# Patient Record
Sex: Male | Born: 1955 | Race: White | Hispanic: No | Marital: Single | Smoking: Never smoker
Health system: Southern US, Community
[De-identification: ages and names within clinical notes are randomized; demographics above are authoritative.]

## PROBLEM LIST (undated history)

## (undated) DIAGNOSIS — F419 Anxiety disorder, unspecified: Secondary | ICD-10-CM

## (undated) DIAGNOSIS — R739 Hyperglycemia, unspecified: Secondary | ICD-10-CM

## (undated) DIAGNOSIS — F1010 Alcohol abuse, uncomplicated: Secondary | ICD-10-CM

## (undated) DIAGNOSIS — Z8249 Family history of ischemic heart disease and other diseases of the circulatory system: Secondary | ICD-10-CM

## (undated) DIAGNOSIS — E785 Hyperlipidemia, unspecified: Secondary | ICD-10-CM

## (undated) DIAGNOSIS — I10 Essential (primary) hypertension: Secondary | ICD-10-CM

## (undated) DIAGNOSIS — M109 Gout, unspecified: Secondary | ICD-10-CM

## (undated) HISTORY — DX: Family history of ischemic heart disease and other diseases of the circulatory system: Z82.49

## (undated) HISTORY — DX: Hyperglycemia, unspecified: R73.9

## (undated) HISTORY — PX: OTHER SURGICAL HISTORY: SHX169

## (undated) HISTORY — DX: Alcohol abuse, uncomplicated: F10.10

## (undated) HISTORY — DX: Hyperlipidemia, unspecified: E78.5

## (undated) HISTORY — DX: Gout, unspecified: M10.9

## (undated) HISTORY — DX: Essential (primary) hypertension: I10

## (undated) HISTORY — DX: Anxiety disorder, unspecified: F41.9

---

## 2003-02-17 ENCOUNTER — Emergency Department (HOSPITAL_COMMUNITY): Admission: EM | Admit: 2003-02-17 | Discharge: 2003-02-17 | Payer: Self-pay | Admitting: Emergency Medicine

## 2003-02-17 ENCOUNTER — Encounter: Payer: Self-pay | Admitting: Emergency Medicine

## 2012-09-04 ENCOUNTER — Encounter: Payer: Self-pay | Admitting: Family Medicine

## 2012-09-04 DIAGNOSIS — R739 Hyperglycemia, unspecified: Secondary | ICD-10-CM | POA: Insufficient documentation

## 2012-09-04 DIAGNOSIS — I10 Essential (primary) hypertension: Secondary | ICD-10-CM | POA: Insufficient documentation

## 2012-09-04 DIAGNOSIS — F411 Generalized anxiety disorder: Secondary | ICD-10-CM | POA: Insufficient documentation

## 2012-09-04 DIAGNOSIS — F1010 Alcohol abuse, uncomplicated: Secondary | ICD-10-CM | POA: Insufficient documentation

## 2012-09-04 DIAGNOSIS — M109 Gout, unspecified: Secondary | ICD-10-CM | POA: Insufficient documentation

## 2012-09-04 DIAGNOSIS — E785 Hyperlipidemia, unspecified: Secondary | ICD-10-CM | POA: Insufficient documentation

## 2012-09-05 ENCOUNTER — Encounter: Payer: Self-pay | Admitting: Physician Assistant

## 2012-09-05 ENCOUNTER — Ambulatory Visit (INDEPENDENT_AMBULATORY_CARE_PROVIDER_SITE_OTHER): Payer: PRIVATE HEALTH INSURANCE | Admitting: Physician Assistant

## 2012-09-05 VITALS — BP 116/80 | HR 60 | Temp 97.9°F | Resp 16 | Ht 69.0 in | Wt 216.0 lb

## 2012-09-05 DIAGNOSIS — F411 Generalized anxiety disorder: Secondary | ICD-10-CM

## 2012-09-05 DIAGNOSIS — E785 Hyperlipidemia, unspecified: Secondary | ICD-10-CM

## 2012-09-05 DIAGNOSIS — R7309 Other abnormal glucose: Secondary | ICD-10-CM

## 2012-09-05 DIAGNOSIS — F1010 Alcohol abuse, uncomplicated: Secondary | ICD-10-CM

## 2012-09-05 DIAGNOSIS — R739 Hyperglycemia, unspecified: Secondary | ICD-10-CM

## 2012-09-05 DIAGNOSIS — I10 Essential (primary) hypertension: Secondary | ICD-10-CM

## 2012-09-05 DIAGNOSIS — M109 Gout, unspecified: Secondary | ICD-10-CM

## 2012-09-05 MED ORDER — NEBIVOLOL HCL 10 MG PO TABS: 10.0000 mg | ORAL_TABLET | Freq: Every day | ORAL | Status: DC

## 2012-09-05 NOTE — Progress Notes (Signed)
Patient ID: Jonathan Hodges MRN: [id number], DOB: 11-Dec-1955, 57 y.o. Date of Encounter: @DATE @  Chief Complaint:  Chief Complaint  Patient presents with  . Hypertension    BP check after new meds    HPI: 57 y.o. year old male  presents for fU of;  1- HTN: At LOV 08/15/12 his BP was very high at 170/108. At that time we continued Benaz 40mg  QD; incresed Norvasc to 10mg ; Added Bystolic -5mg  then up to 10 mg. Reviewed each of these with pt. He is taking all meds as directed. No adv effects. Feels much, much better.  2-Anxiety: Is taking Celexa QD (Started at LOV 08/15/12). Cannot tell much difference in anxiety so far. Has only used Klonopin a few time but could see very little effect from it.   3- DM/Hyperglycemia: Pt has made no diet changes. Has given it no thought. 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 Has to walk a lot, climb stairs etc.  4- Hyperlipidemia: Started Zocor 10mg  9/13. Had f/u lab 3/14. Much better!   Past Medical History  Diagnosis Date  . Hyperlipidemia   . Hypertension   . Gout      Home Meds:These are correct with the addition of BYSTOLIC 10MG  QD Current Outpatient Prescriptions on File Prior to Visit  Medication Sig Dispense Refill  . allopurinol (ZYLOPRIM) 300 MG tablet Take 300 mg by mouth daily.      Marland Kitchen amLODipine (NORVASC) 10 MG tablet Take 10 mg by mouth daily.      . benazepril (LOTENSIN) 40 MG tablet Take 40 mg by mouth daily.      . citalopram (CELEXA) 20 MG tablet Take 20 mg by mouth daily.      . clonazePAM (KLONOPIN) 0.5 MG tablet Take 0.5 mg by mouth every 8 (eight) hours as needed for anxiety.      . indomethacin (INDOCIN) 50 MG capsule Take 50 mg by mouth 3 (three) times daily with meals. PRN gout flare up      . Multiple Vitamin (MULTIVITAMIN) tablet Take 1 tablet by mouth daily. Centrum      . simvastatin (ZOCOR) 10 MG tablet Take 10 mg by mouth at bedtime.       No current facility-administered medications on file prior to visit.     Allergies: No Known Allergies  History   Social History  . Marital Status: Single    Spouse Name: N/A    Number of Children: N/A  . Years of Education: N/A   Occupational History  . Not on file.   Social History Main Topics  . Smoking status: Never Smoker   . Smokeless tobacco: Not on file  . Alcohol Use: Yes  . Drug Use: Not on file  . Sexually Active: Not on file   Other Topics Concern  . Not on file   Social History Narrative  . No narrative on file    Family History  Problem Relation Age of Onset  . Heart disease Mother   . Hyperlipidemia Mother   . Hypertension Father   . Stroke Father   . Hypertension Sister   . Heart disease Brother      Review of Systems: Constitutional: negative for chills, fever, night sweats, weight changes, or fatigue  HEENT: negative for vision changes, hearing loss, congestion, rhinorrhea, ST, epistaxis, or sinus pressure Cardiovascular: negative for chest pain or palpitations. No new/increased shortness of breath or dyspnea on exertion Respiratory: negative for hemoptysis, wheezing, shortness of breath,  or cough Abdominal: negative for abdominal pain, nausea, vomiting, diarrhea, or constipation Dermatological: negative for rash or concerning skin lesions Neurologic: negative for headache, dizziness, or syncope All other systems reviewed and are otherwise negative with the exception to those above and in the HPI.   Physical Exam: Blood pressure 116/80, pulse 60, temperature 97.9 F (36.6 C), temperature source Oral, resp. rate 16, height 5\' 9"  (1.753 m), weight 216 lb (97.977 kg)., Body mass index is 31.88 kg/(m^2). General: Mod Obese WM in no acute distress. Head: Normocephalic, atraumatic, eyes without discharge, sclera non-icteric, nares are without discharge. Bilateral auditory canals clear, TM's are without perforation, pearly grey and translucent with reflective cone of light bilaterally. Oral cavity moist, posterior  pharynx without exudate, erythema, peritonsillar abscess, or post nasal drip.  Neck: Supple. No thyromegaly. Full ROM. No lymphadenopathy. Lungs: Clear bilaterally to auscultation without wheezes, rales, or rhonchi. Breathing is unlabored. Heart: RRR with S1 S2. No murmurs, rubs, or gallops. Abdomen: Soft, non-tender, non-distended with normoactive bowel sounds. No hepatomegaly. No rebound/guarding. No obvious abdominal masses. Musculoskeletal:  Strength and tone normal for age. Extremities/Skin: Warm and dry. No clubbing or cyanosis. No edema. No rashes or suspicious lesions. Neuro: Alert and oriented X 3. Moves all extremities spontaneously. Gait is normal. CNII-XII grossly in tact. Psych:  Responds to questions appropriately with a normal affect.Does not appear as anxious today. Not shaky like he was at LOV    ASSESSMENT AND PLAN:  57 y.o. year old male with  1. Hypertension At goal. Cont current meds. Refills sent for Bystolic.  - nebivolol (BYSTOLIC) 10 MG tablet; Take 1 tablet (10 mg total) by mouth daily.  Dispense: 30 tablet; Refill: 11  2. GAD (generalized anxiety disorder) Cont Celexa 20mg . Give it more time to see if this dose will be effective.  May use 2 Klonopin at a time since one ineffective.  3. Hyperglycemia Discussed diet at length today. He says he does still have the handout I gave him previously.  Currently he is eating:  Breakfast: Fruit Lunch: Sub Dinner: Pizza, fried chicken, soup.  Drinks 4 beers QHS  Discussed decreasing bread/ bread products(pizza). Decrease beer to 2 per day if cant quit completely. He is aware this also affects gout and liver as well as blood sugar.   4. Hyperlipidemia Cont Zocor 10mg --Excellent LDL reduction with this.  5. Gout Uric Acid level at goal at lab 3/13. Cont Allopurinol. Decrease/stop alcohol.  6. Alcohol abuse, daily use If cannot stop completely, at least decrease so that max is 2 per day.   7. Premature Family H/O  CAD: Brother recently died of MI @ age 77. Today I discussed scheduling a Cardiolite. He defers at this time. 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 I still recommended Cardiolite but he still deferred. Will let him think it over and will rediscuss at next OV. He agrees to f/u immediately if he develops angina symptoms. Educated, discussed those symptoms, as listed above.  8 Preventive Care; Screening PSA: NML 8/13 Screening Colonoscopy: He has never had. I have discussed risks/benefits. He defers. Immunizations: Tetanus UTD per pt.   Will plan for f/u OV in 3 mos. Sooner if needed.   Signed, [address] [place], [place], [facility] 09/05/2012 10:45 AM

## 2012-09-12 ENCOUNTER — Telehealth: Payer: Self-pay | Admitting: Physician Assistant

## 2012-09-12 MED ORDER — BENAZEPRIL HCL 40 MG PO TABS: 40.0000 mg | ORAL_TABLET | Freq: Every day | ORAL | Status: DC

## 2012-09-12 MED ORDER — SIMVASTATIN 10 MG PO TABS: 10.0000 mg | ORAL_TABLET | Freq: Every day | ORAL | Status: DC

## 2012-09-12 NOTE — Telephone Encounter (Signed)
Medication refilled per protocol. 

## 2012-09-18 ENCOUNTER — Telehealth: Payer: Self-pay | Admitting: Family Medicine

## 2012-09-18 MED ORDER — CLONAZEPAM 0.5 MG PO TABS: 0.5000 mg | ORAL_TABLET | Freq: Three times a day (TID) | ORAL | Status: DC | PRN

## 2012-09-18 MED ORDER — INDOMETHACIN 50 MG PO CAPS: 50.0000 mg | ORAL_CAPSULE | Freq: Three times a day (TID) | ORAL | Status: DC

## 2012-09-18 NOTE — Telephone Encounter (Signed)
Clonazepam uses sparingly so pt stated at last office visit.  Refill #30 only no additional refills. Prn indomethacin refilled.

## 2012-10-08 ENCOUNTER — Telehealth: Payer: Self-pay | Admitting: Physician Assistant

## 2012-10-08 MED ORDER — CITALOPRAM HYDROBROMIDE 20 MG PO TABS: 20.0000 mg | ORAL_TABLET | Freq: Every day | ORAL | Status: DC

## 2012-10-08 NOTE — Telephone Encounter (Signed)
Rx Refilled  

## 2012-10-10 ENCOUNTER — Telehealth: Payer: Self-pay | Admitting: Physician Assistant

## 2012-10-10 MED ORDER — CLONAZEPAM 0.5 MG PO TABS: 0.5000 mg | ORAL_TABLET | Freq: Three times a day (TID) | ORAL | Status: DC | PRN

## 2012-10-10 NOTE — Telephone Encounter (Signed)
Ok to refill the clonazepam?

## 2012-10-10 NOTE — Telephone Encounter (Signed)
Needs f/u OV.  Can refill meds for one month to hold him over.

## 2012-10-10 NOTE — Telephone Encounter (Signed)
Called out script,pt aware

## 2012-10-27 ENCOUNTER — Telehealth: Payer: Self-pay | Admitting: Physician Assistant

## 2012-10-28 NOTE — Telephone Encounter (Signed)
Last refill 10/10/12  #30   Clonazepam 0.5mg  Q8hrs prn.   Last OV 09/05/12 Need approval for controlled medication.

## 2012-10-28 NOTE — Telephone Encounter (Signed)
Call patient and find out if the Celexa seems to be helping with his general anxiety level or not.  He was initially seen for anxiety 08/15/12-Celexa and Klonopin were started then.  However, his BP was also very high at that OV. He came back for f/u OV 4/4--his BP was at goal.  At that time, I told him we would need to give it more time to see whether the Celexa 20 mg would be effective or not. It has now been 6 weeks so it should be "kicking in" and hopefully starting to help. See if he has noticed any affect.

## 2012-10-29 ENCOUNTER — Telehealth: Payer: Self-pay | Admitting: Physician Assistant

## 2012-10-29 NOTE — Telephone Encounter (Signed)
Spoke to patient.  He states he can not tell any difference in his anxiety since starting Celexa.  When asked how often he takes Clonazepam his response was almost daily and takes at same time as Celexa.  States does not feel as calm if only takes Celexa, which he has done but not often.  Then occasionally takes clonazepam in PM if has had bad day.  Please advise about refill request.

## 2012-10-30 MED ORDER — CLONAZEPAM 0.5 MG PO TABS: 0.5000 mg | ORAL_TABLET | Freq: Two times a day (BID) | ORAL | Status: DC | PRN

## 2012-10-30 NOTE — Telephone Encounter (Signed)
refill called to pharmacy  Left pt mess to call and schedule OV in next few weeks

## 2012-10-30 NOTE — Telephone Encounter (Signed)
Refill Clonazepam 0.5mg  one po BID prn # 60/0. Tell him to cont Celexa QD.  Schedule OV to f/u with me when he is available- in next 1-2 weeks if possible.

## 2012-10-31 NOTE — Telephone Encounter (Signed)
DONE 10/30/12

## 2012-12-03 ENCOUNTER — Telehealth: Payer: Self-pay | Admitting: Physician Assistant

## 2012-12-03 NOTE — Telephone Encounter (Signed)
Due for OV. LOV 09/05/12. Was to f/u 3 months. (12/05/12)

## 2012-12-03 NOTE — Telephone Encounter (Signed)
?  ok to refill °

## 2012-12-03 NOTE — Telephone Encounter (Signed)
LMTRC

## 2012-12-04 MED ORDER — CLONAZEPAM 0.5 MG PO TABS: 0.5000 mg | ORAL_TABLET | Freq: Two times a day (BID) | ORAL | Status: DC | PRN

## 2012-12-04 NOTE — Telephone Encounter (Signed)
Yes. Fill #60 / 0 refills

## 2012-12-04 NOTE — Telephone Encounter (Signed)
Rx Refilled  

## 2012-12-04 NOTE — Telephone Encounter (Signed)
Pt made appt for July 18 wants to know if you can refill his rx now.

## 2012-12-18 ENCOUNTER — Ambulatory Visit (INDEPENDENT_AMBULATORY_CARE_PROVIDER_SITE_OTHER): Payer: PRIVATE HEALTH INSURANCE | Admitting: Physician Assistant

## 2012-12-18 ENCOUNTER — Encounter: Payer: Self-pay | Admitting: Physician Assistant

## 2012-12-18 VITALS — BP 138/80 | HR 78 | Temp 98.3°F | Resp 15 | Wt 210.0 lb

## 2012-12-18 DIAGNOSIS — F411 Generalized anxiety disorder: Secondary | ICD-10-CM

## 2012-12-18 DIAGNOSIS — F1010 Alcohol abuse, uncomplicated: Secondary | ICD-10-CM

## 2012-12-18 DIAGNOSIS — M109 Gout, unspecified: Secondary | ICD-10-CM

## 2012-12-18 DIAGNOSIS — E785 Hyperlipidemia, unspecified: Secondary | ICD-10-CM

## 2012-12-18 DIAGNOSIS — R7309 Other abnormal glucose: Secondary | ICD-10-CM

## 2012-12-18 DIAGNOSIS — I10 Essential (primary) hypertension: Secondary | ICD-10-CM

## 2012-12-18 DIAGNOSIS — R739 Hyperglycemia, unspecified: Secondary | ICD-10-CM

## 2012-12-18 MED ORDER — SERTRALINE HCL 50 MG PO TABS: 50.0000 mg | ORAL_TABLET | Freq: Every day | ORAL | Status: DC

## 2012-12-18 NOTE — Progress Notes (Signed)
Patient ID: Jonathan Hodges MRN: [id number], DOB: [birth year], 57 y.o. Date of Encounter: @DATE @  Chief Complaint:  Chief Complaint  Patient presents with  . Medication Refill    HPI: 57 y.o. year old white male  Presents for routine f/u.   1- Anxiety: He says that Celexa is causing him to be "moody." Multiple friends have told him they have noticed change in his personality. He can tell                   he is more moody and irritable than in past.       He does take a klonopin every morning and this helps. Sometimes uses 2 at a time as I recommended at LOV, sometimes only needs to                take one at at time.   2-HTN: Taking meds as directed. No adv effects  3-HLD: still taking zocor. No myalgias or adv effects.  4-Hyperglycemia: Has made a little bit of diet change but not much.   Now for Brakfast-eats granola bar every morning.   Lunch: still eats subs  Dinner: has decreased the pizzas. Is eating more soups-vegetable/beef soup.  5-Alcohol: still drinking 4 beers QHS-did not decrease at all despite LOV conversation.   Past Medical History  Diagnosis Date  . Hyperlipidemia   . Hypertension   . Gout      Home Meds: See attached medication section for current medication list. Any medications entered into computer today will not appear on this note's list. The medications listed below were entered prior to today. Current Outpatient Prescriptions on File Prior to Visit  Medication Sig Dispense Refill  . allopurinol (ZYLOPRIM) 300 MG tablet Take 300 mg by mouth daily.      Marland Kitchen amLODipine (NORVASC) 10 MG tablet Take 10 mg by mouth daily.      . benazepril (LOTENSIN) 40 MG tablet Take 1 tablet (40 mg total) by mouth daily.  30 tablet  5  . clonazePAM (KLONOPIN) 0.5 MG tablet Take 1 tablet (0.5 mg total) by mouth 2 (two) times daily as needed for anxiety.  60 tablet  0  . Multiple Vitamin (MULTIVITAMIN) tablet Take 1 tablet by mouth daily. Centrum      . nebivolol (BYSTOLIC) 10  MG tablet Take 1 tablet (10 mg total) by mouth daily.  30 tablet  11  . simvastatin (ZOCOR) 10 MG tablet Take 1 tablet (10 mg total) by mouth at bedtime.  30 tablet  5  . indomethacin (INDOCIN) 50 MG capsule Take 1 capsule (50 mg total) by mouth 3 (three) times daily with meals. PRN gout flare up  60 capsule  0   No current facility-administered medications on file prior to visit.    Allergies: No Known Allergies  History   Social History  . Marital Status: Single    Spouse Name: N/A    Number of Children: N/A  . Years of Education: N/A   Occupational History  . Not on file.   Social History Main Topics  . Smoking status: Never Smoker   . Smokeless tobacco: Not on file  . Alcohol Use: Yes  . Drug Use: Not on file  . Sexually Active: Not on file   Other Topics Concern  . Not on file   Social History Narrative  . No narrative on file    Family History  Problem Relation Age of Onset  . Heart disease Mother   .  Hyperlipidemia Mother   . Hypertension Father   . Stroke Father   . Hypertension Sister   . Heart disease Brother      Review of Systems:  See HPI for pertinent ROS. All other ROS negative.    Physical Exam: Blood pressure 138/80, pulse 78, temperature 98.3 F (36.8 C), temperature source Oral, resp. rate 15, weight 210 lb (95.255 kg)., Body mass index is 31 kg/(m^2). General: WNWD WM. Appears in no acute distress. Neck: Supple. No thyromegaly. No lymphadenopathy. No carotid Bruit. Lungs: Clear bilaterally to auscultation without wheezes, rales, or rhonchi. Breathing is unlabored. Heart: RRR with S1 S2. No murmurs, rubs, or gallops. Abdomen: Soft, non-tender, non-distended with normoactive bowel sounds. No hepatomegaly. No rebound/guarding. No obvious abdominal masses. Musculoskeletal:  Strength and tone normal for age. Extremities/Skin: Warm and dry. No clubbing or cyanosis. No edema. No rashes or suspicious lesions. Neuro: Alert and oriented X 3. Moves  all extremities spontaneously. Gait is normal. CNII-XII grossly in tact. Psych:  Responds to questions appropriately with a normal affect.He does have a tremor when he speaks. Nervous.     ASSESSMENT AND PLAN:  57 y.o. year old male with  1. GAD (generalized anxiety disorder) Stop the celexa. Start Zoloft. Discussed proper expectation of med. Cont klonopin prn. F/U 6 weeks, sooner if any adv effects.  - sertraline (ZOLOFT) 50 MG tablet; Take 1 tablet (50 mg total) by mouth daily.  Dispense: 30 tablet; Refill: 3  2. Hyperglycemia - Hemoglobin A1C, fingerstick  3. Hyperlipidemia Was at goal 3/14. Cont current med.  4. Hypertension At goal. BMET nml 3/14.  5. Gout No flare. Controlled.  Uric Acid at goal at recent check. Cont Allopurinol. Need to decrease/stop alcohol.  6. Alcohol abuse, daily use Disussed need to decrease this again today  7. Premature Family H/O CAD: Brother recently died of MI @ age 31. Today I discussed scheduling a Cardiolite. He defers at this time. 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 I still recommended Cardiolite but he still deferred. Will let him think it over and will rediscuss at next OV. He agrees to f/u immediately if he develops angina symptoms. Educated, discussed those symptoms, as listed above.   8 Preventive Care;   Screening PSA: NML 8/13   Screening Colonoscopy: He has never had. I have discussed risks/benefits. He defers.   Immunizations: Tetanus UTD per pt.   Will plan for f/u OV in 3 mos. Sooner if needed.     Jonathan Hodges [place], [place], [facility] 12/18/2012 6:49 PM

## 2012-12-19 ENCOUNTER — Encounter: Payer: Self-pay | Admitting: Family Medicine

## 2013-01-13 ENCOUNTER — Other Ambulatory Visit: Payer: Self-pay | Admitting: Physician Assistant

## 2013-01-13 NOTE — Telephone Encounter (Signed)
Pt just seen 12/18/12.  Last refill 12/04/12. refill appropriate  One month called in no refills

## 2013-01-13 NOTE — Telephone Encounter (Signed)
?   Ok to refill, last refill 12/04/12 °

## 2013-01-13 NOTE — Telephone Encounter (Signed)
Approved.  

## 2013-01-27 ENCOUNTER — Other Ambulatory Visit: Payer: Self-pay | Admitting: Physician Assistant

## 2013-01-28 NOTE — Telephone Encounter (Signed)
Refill denied.  Have been trying to call patient and have also sent letter (returned)  Hopefully he will call office when we deny RX.

## 2013-01-30 ENCOUNTER — Telehealth: Payer: Self-pay | Admitting: Physician Assistant

## 2013-01-30 ENCOUNTER — Other Ambulatory Visit: Payer: Self-pay | Admitting: Physician Assistant

## 2013-01-30 MED ORDER — ALLOPURINOL 300 MG PO TABS: 300.0000 mg | ORAL_TABLET | Freq: Every day | ORAL | Status: DC

## 2013-01-30 NOTE — Telephone Encounter (Signed)
Refill denied because need to contact patient and he has not been responding.

## 2013-01-30 NOTE — Telephone Encounter (Signed)
Allopurinol 300 mg 1 QD #30

## 2013-02-11 ENCOUNTER — Other Ambulatory Visit: Payer: Self-pay | Admitting: Physician Assistant

## 2013-02-12 NOTE — Telephone Encounter (Signed)
OK refill?? 

## 2013-02-12 NOTE — Telephone Encounter (Signed)
Refills denied until patient contacts office

## 2013-02-12 NOTE — Telephone Encounter (Signed)
Denied. Last office visit was July 2014. At that time I started Zoloft. Patient was to have followup visit in 6 weeks. He has had no followup visit. Also noted is the note attached to his labs from that time. We were unable to get in touch with him by phone and we sent a letter which was returned. Maybe the pharmacy will be able to have contact with him!!! They can tell the patient that he needs to be seen for followup visit. No Refills until office visit

## 2013-02-24 ENCOUNTER — Other Ambulatory Visit: Payer: Self-pay | Admitting: Physician Assistant

## 2013-02-25 NOTE — Telephone Encounter (Signed)
All med refills are being denied until patient contacts office.  Needs follow up appt  And we need updated contact information. Phone numbers invalid, letters are returned.

## 2013-03-01 ENCOUNTER — Other Ambulatory Visit: Payer: Self-pay | Admitting: Physician Assistant

## 2013-03-02 ENCOUNTER — Other Ambulatory Visit: Payer: Self-pay | Admitting: Physician Assistant

## 2013-03-02 NOTE — Telephone Encounter (Signed)
All meds being denied until pt contacts office.  Have been trying to reach.  Invalid phone numbers and letters are being returned.

## 2013-03-03 NOTE — Telephone Encounter (Signed)
Refills denied until pt contacts office  Have not been able to reach via phone or mail.  No refills until contacts [place] to update information and to advise about lab work and follow up appts needed.

## 2013-03-05 ENCOUNTER — Encounter: Payer: Self-pay | Admitting: Physician Assistant

## 2013-03-05 ENCOUNTER — Ambulatory Visit (INDEPENDENT_AMBULATORY_CARE_PROVIDER_SITE_OTHER): Payer: PRIVATE HEALTH INSURANCE | Admitting: Physician Assistant

## 2013-03-05 VITALS — BP 118/60 | HR 88 | Temp 97.2°F | Resp 18 | Wt 198.0 lb

## 2013-03-05 DIAGNOSIS — G609 Hereditary and idiopathic neuropathy, unspecified: Secondary | ICD-10-CM

## 2013-03-05 DIAGNOSIS — Z125 Encounter for screening for malignant neoplasm of prostate: Secondary | ICD-10-CM

## 2013-03-05 DIAGNOSIS — G629 Polyneuropathy, unspecified: Secondary | ICD-10-CM

## 2013-03-05 DIAGNOSIS — F411 Generalized anxiety disorder: Secondary | ICD-10-CM

## 2013-03-05 DIAGNOSIS — Z8249 Family history of ischemic heart disease and other diseases of the circulatory system: Secondary | ICD-10-CM | POA: Insufficient documentation

## 2013-03-05 DIAGNOSIS — F1010 Alcohol abuse, uncomplicated: Secondary | ICD-10-CM | POA: Insufficient documentation

## 2013-03-05 DIAGNOSIS — E785 Hyperlipidemia, unspecified: Secondary | ICD-10-CM

## 2013-03-05 DIAGNOSIS — R7309 Other abnormal glucose: Secondary | ICD-10-CM

## 2013-03-05 DIAGNOSIS — R739 Hyperglycemia, unspecified: Secondary | ICD-10-CM

## 2013-03-05 DIAGNOSIS — I10 Essential (primary) hypertension: Secondary | ICD-10-CM

## 2013-03-05 DIAGNOSIS — M109 Gout, unspecified: Secondary | ICD-10-CM

## 2013-03-05 DIAGNOSIS — F419 Anxiety disorder, unspecified: Secondary | ICD-10-CM | POA: Insufficient documentation

## 2013-03-05 LAB — COMPLETE METABOLIC PANEL WITH GFR
ALT: 76 U/L — ABNORMAL HIGH (ref 0–53)
AST: 90 U/L — ABNORMAL HIGH (ref 0–37)
Albumin: 4.5 g/dL (ref 3.5–5.2)
CO2: 30 mEq/L (ref 19–32)
Calcium: 10 mg/dL (ref 8.4–10.5)
Chloride: 104 mEq/L (ref 96–112)
Creat: 0.82 mg/dL (ref 0.50–1.35)
GFR, Est African American: >89 mL/min
Potassium: 4.6 mEq/L (ref 3.5–5.3)
Sodium: 141 mEq/L (ref 135–145)
Total Protein: 7.6 g/dL (ref 6.0–8.3)

## 2013-03-05 LAB — LIPID PANEL: Triglycerides: 406 mg/dL — ABNORMAL HIGH (ref <150)

## 2013-03-05 LAB — HEMOGLOBIN A1C
Hgb A1c MFr Bld: 5.5 % (ref <5.7)
Mean Plasma Glucose: 111 mg/dL (ref <117)

## 2013-03-05 LAB — URIC ACID: Uric Acid, Serum: 4.9 mg/dL (ref 4.0–7.8)

## 2013-03-05 LAB — PSA: PSA: 0.66 ng/mL (ref <=4.00)

## 2013-03-05 LAB — FOLATE: Folate: 17.1 ng/mL

## 2013-03-05 LAB — VITAMIN B12: Vitamin B-12: 774 pg/mL (ref 211–911)

## 2013-03-05 MED ORDER — SIMVASTATIN 10 MG PO TABS: 10.0000 mg | ORAL_TABLET | Freq: Every day | ORAL | Status: DC

## 2013-03-05 MED ORDER — NEBIVOLOL HCL 10 MG PO TABS: 10.0000 mg | ORAL_TABLET | Freq: Every day | ORAL | Status: DC

## 2013-03-05 MED ORDER — DULOXETINE HCL 60 MG PO CPEP: 60.0000 mg | ORAL_CAPSULE | Freq: Every day | ORAL | Status: DC

## 2013-03-05 MED ORDER — ALLOPURINOL 300 MG PO TABS: 300.0000 mg | ORAL_TABLET | Freq: Every day | ORAL | Status: DC

## 2013-03-05 MED ORDER — CLONAZEPAM 0.5 MG PO TABS: ORAL_TABLET | ORAL | Status: DC

## 2013-03-05 NOTE — Progress Notes (Signed)
Patient ID: Adrion Menz MRN: [id number], DOB: [birth year], 57 y.o. Date of Encounter: @DATE @  Chief Complaint:  Chief Complaint  Patient presents with  . 3 mos f/u    HPI: 57 y.o. year old white male  Presents for routine followup office visit.  #1 anxiety: His last office visit with me was 12/18/12. At that time he reported that the Celexa caused him to be quite mute he quit. He said that multiple friends and told him they had noticed a change in his personality. He was having increased irritability. At that visit I had him stop the Celexa and we started Zoloft 50 mg. Daily he tells me he took Zoloft for a full 2 months. Says he just quit about 2 weeks ago. Says the Zoloft made him "just didn't feel right and made him so confused and forgetful ". He continues to feel very anxious and uptight. Is very nervous.  #2 hypertension: Taking medications as directed. No adverse effects. No lightheadedness or lower stomach edema.  #3 hyperlipidemia: Still taking Zocor. No myalgias or other adverse effects.  #4 hyperglycemia: Today he says he still has made very little changes in his diet.  #5 alcohol use: He is still drinking 4 beers every night. Says that he drinks an 18 pack over the weekend. He agrees that he is using alcohol to self treat his anxiety. Says  "it helps him to calm down."  #6 gout: He does not mention this until I specifically asked if he has had any gout flare. And he does say he has had a flare in his left ankle and toe recently. Is much improved but has not completely resolved. However he says that he is only taking the Indocin once a day.  He also says that his toes on both feet feel numb and tingly at night and early in the morning.   Past Medical History  Diagnosis Date  . Hyperlipidemia   . Hypertension   . Gout   . Alcohol abuse   . Anxiety   . Hyperglycemia   . Family history of premature coronary artery disease      Home Meds: See attached medication section  for current medication list. Any medications entered into computer today will not appear on this note's list. The medications listed below were entered prior to today. Current Outpatient Prescriptions on File Prior to Visit  Medication Sig Dispense Refill  . amLODipine (NORVASC) 10 MG tablet Take 10 mg by mouth daily.      . benazepril (LOTENSIN) 40 MG tablet Take 1 tablet (40 mg total) by mouth daily.  30 tablet  5  . indomethacin (INDOCIN) 50 MG capsule Take 1 capsule (50 mg total) by mouth 3 (three) times daily with meals. PRN gout flare up  60 capsule  0  . Multiple Vitamin (MULTIVITAMIN) tablet Take 1 tablet by mouth daily. Centrum       No current facility-administered medications on file prior to visit.    Allergies:  Allergies  Allergen Reactions  . Celexa [Citalopram Hydrobromide]     "Moody", "Change in personality", "Agitated"  . Zoloft [Sertraline Hcl]     "Just didn't feel right" "confused, couldn't think clearly"    History   Social History  . Marital Status: Single    Spouse Name: N/A    Number of Children: N/A  . Years of Education: N/A   Occupational History  . Not on file.   Social History Main Topics  . Smoking  status: Never Smoker   . Smokeless tobacco: Not on file  . Alcohol Use: Yes  . Drug Use: Not on file  . Sexual Activity: Not on file   Other Topics Concern  . Not on file   Social History Narrative   Divorced. Lives alone.   Works as Personnel officer.    Family History  Problem Relation Age of Onset  . Heart disease Mother   . Hyperlipidemia Mother   . Hypertension Father   . Stroke Father   . Hypertension Sister   . Heart disease Brother      Review of Systems:  See HPI for pertinent ROS. All other ROS negative.    Physical Exam: Blood pressure 118/60, pulse 88, temperature 97.2 F (36.2 C), temperature source Oral, resp. rate 18, weight 198 lb (89.812 kg)., Body mass index is 29.23 kg/(m^2). General: Well nourished well developed  white male. Appears in no acute distress. Neck: Supple. No thyromegaly. No lymphadenopathy. No carotid bruits. Lungs: Clear bilaterally to auscultation without wheezes, rales, or rhonchi. Breathing is unlabored. Heart: RRR with S1 S2. No murmurs, rubs, or gallops. Abdomen: Soft, non-tender, non-distended with normoactive bowel sounds. No hepatomegaly. No rebound/guarding. No obvious abdominal masses. Musculoskeletal:  Strength and tone normal for age. Extremities/Skin: Warm and dry. Left Foot: There is some pinkish erythema at the base of the first toe. No warmth or swelling.   He has 2+ dorsalis pedis and posterior tibial pulses bilaterally. Neuro: Alert and oriented X 3. Moves all extremities spontaneously. Gait is normal. CNII-XII grossly in tact. Psych:  Responds to questions appropriately. Does appear anxious.       ASSESSMENT AND PLAN:  57 y.o. year old male with  1. Family history of premature coronary artery disease Brother recently died of MI age 93. Last office visit when he informed me of this I discussed scheduling patient for Cardiolite. However he deferred. Patient reports that even with exertion he is having no chest pressure heaviness tightness squeezing. No increased shortness of breath or dyspnea on exertion. He is to seek medical evaluation immediately if he does develop these symptoms. Defers Cardiolite at this time.  2. Hyperlipidemia Was at goal 3/14. Recheck now. - COMPLETE METABOLIC PANEL WITH GFR - Lipid panel  3. Hypertension At goal. Continue current medication. - COMPLETE METABOLIC PANEL WITH GFR - nebivolol (BYSTOLIC) 10 MG tablet; Take 1 tablet (10 mg total) by mouth daily.  Dispense: 30 tablet; Refill: 5  4. Hyperglycemia 12/18/12 A1c was 5.2. He has been noncompliant with dietary changes. We'll continue to monitor. - Hemoglobin A1c  5. GAD (generalized anxiety disorder) He has been intolerant to both Celexa and Zoloft. However he has significant  anxiety. He agrees that he is self-medicating his anxiety with his alcohol. He really needs to be on some type of medication to control this. We'll try SNRI to see if this works better for him given his intolerance to SSRIs. I discussed expectations of medications with him at length. If he develops adverse effects the notify me immediately. Otherwise continue the current medication and followup office visit in 6 weeks.  Continue to use the Klonopin as needed in the interim. - DULoxetine (CYMBALTA) 60 MG capsule; Take 1 capsule (60 mg total) by mouth daily.  Dispense: 30 capsule; Refill: 1 - clonazePAM (KLONOPIN) 0.5 MG tablet; TAKE 1 TABLET TWICE A DAY AS NEEDED  Dispense: 60 tablet; Refill: 2  6. Alcohol abuse, daily use I discussed repeatedly the need to decrease his alcohol  intake.  Have discussed the effects on his liver. Have discussed the effects on his gout. Discussed the effects on his hyperglycemia and dyslipidemia. As well have discussed effects on general health.  7. Gout Need to decrease alcohol intake as above. He is on allopurinol. However he is having recurrent episode. If uric acid level is elevated will look at increasing allopurinol dose or changing to Uloric. - COMPLETE METABOLIC PANEL WITH GFR - Uric acid  8. Prostate cancer screening  last PSA was 01/2012 normal. Repeat now. - PSA  9. Peripheral neuropathy Maybe secondary to his alcohol abuse and may have thiamine deficiency. We'll check labs as below.  Pulses were intact so I do not think arterial insufficiency is the cause. - TSH - Vitamin B12 - Folate  #10 screening colonoscopy: He has never had a period discussed the risks and benefits repeatedly and he still defers.  11 immunizations: Tetanus is up-to-date per patient.        Discussed influenza vaccine with him today. He defers.  Followup office visit 6 weeks or sooner if he needs.   [address] [place], [place], [facility] 03/05/2013 3:04 PM

## 2013-03-06 ENCOUNTER — Telehealth: Payer: Self-pay | Admitting: Family Medicine

## 2013-03-06 DIAGNOSIS — G629 Polyneuropathy, unspecified: Secondary | ICD-10-CM

## 2013-03-06 NOTE — Telephone Encounter (Signed)
Patient returned my call and will come in to have drawn.  May be a week until able to do so.

## 2013-03-06 NOTE — Telephone Encounter (Signed)
Provider wants to add a Vitamin B1 (Thiamine) level to labs drawn.  Lab has told me this is a frozen, light protected specimen.  Patient will have to come to office to have drawn.  Have left patient mess at home number on file.  Work number was invalid.

## 2013-03-11 ENCOUNTER — Other Ambulatory Visit: Payer: Self-pay | Admitting: Family Medicine

## 2013-03-11 MED ORDER — BENAZEPRIL HCL 40 MG PO TABS: 40.0000 mg | ORAL_TABLET | Freq: Every day | ORAL | Status: DC

## 2013-03-11 NOTE — Telephone Encounter (Signed)
Rx Refilled  

## 2013-03-13 ENCOUNTER — Other Ambulatory Visit: Payer: Self-pay | Admitting: Physician Assistant

## 2013-03-26 ENCOUNTER — Ambulatory Visit: Payer: PRIVATE HEALTH INSURANCE | Admitting: Physician Assistant

## 2013-04-27 ENCOUNTER — Ambulatory Visit: Payer: PRIVATE HEALTH INSURANCE | Admitting: Physician Assistant

## 2013-05-03 ENCOUNTER — Other Ambulatory Visit: Payer: Self-pay | Admitting: Physician Assistant

## 2013-05-04 NOTE — Telephone Encounter (Signed)
Dismissed pt. Refill denied

## 2013-05-06 ENCOUNTER — Other Ambulatory Visit: Payer: Self-pay | Admitting: Physician Assistant

## 2013-05-07 ENCOUNTER — Encounter: Payer: Self-pay | Admitting: Family Medicine

## 2013-05-07 NOTE — Telephone Encounter (Signed)
Medication refill for one time only.  Patient needs to be seen.  Letter sent for patient to call and schedule 

## 2013-05-25 ENCOUNTER — Encounter: Payer: Self-pay | Admitting: Physician Assistant

## 2013-05-25 ENCOUNTER — Ambulatory Visit (INDEPENDENT_AMBULATORY_CARE_PROVIDER_SITE_OTHER): Payer: PRIVATE HEALTH INSURANCE | Admitting: Physician Assistant

## 2013-05-25 VITALS — BP 168/106 | HR 84 | Temp 98.5°F | Resp 18 | Wt 206.0 lb

## 2013-05-25 DIAGNOSIS — Z8249 Family history of ischemic heart disease and other diseases of the circulatory system: Secondary | ICD-10-CM

## 2013-05-25 DIAGNOSIS — R739 Hyperglycemia, unspecified: Secondary | ICD-10-CM

## 2013-05-25 DIAGNOSIS — F411 Generalized anxiety disorder: Secondary | ICD-10-CM

## 2013-05-25 DIAGNOSIS — F1010 Alcohol abuse, uncomplicated: Secondary | ICD-10-CM

## 2013-05-25 DIAGNOSIS — I10 Essential (primary) hypertension: Secondary | ICD-10-CM

## 2013-05-25 DIAGNOSIS — M109 Gout, unspecified: Secondary | ICD-10-CM

## 2013-05-25 DIAGNOSIS — R7309 Other abnormal glucose: Secondary | ICD-10-CM

## 2013-05-25 DIAGNOSIS — E785 Hyperlipidemia, unspecified: Secondary | ICD-10-CM

## 2013-05-25 MED ORDER — DULOXETINE HCL 60 MG PO CPEP: ORAL_CAPSULE | ORAL | Status: DC

## 2013-05-25 MED ORDER — ATENOLOL 50 MG PO TABS: 50.0000 mg | ORAL_TABLET | Freq: Every day | ORAL | Status: DC

## 2013-05-25 MED ORDER — DULOXETINE HCL 30 MG PO CPEP: ORAL_CAPSULE | ORAL | Status: DC

## 2013-05-26 LAB — VITAMIN B1

## 2013-05-26 NOTE — Progress Notes (Signed)
Patient ID: Jonathan Hodges MRN: [id number], DOB: 1955/12/01, 57 y.o. Date of Encounter: @DATE @  Chief Complaint:  Chief Complaint  Patient presents with  . 6 wk follow up    HPI: 57 y.o. year old white male  presents for routine followup office visit.  Anxiety: At recent office visits I have treated him with Zoloft but that caused him to "just not feel right and made him feel confused and forgetful ".  Then we used Celexa. However that also caused adverse effects including increased irritability and change in personality. At his last office visit with me on 03/05/13 started Cymbalta 60 mg. Today he says that he is tolerating this well with no adverse effects. He says that he thinks his anxiety and nervousness have decreased somewhat with the medication. He sees some slight improvement. He says that he has always been very uptight and a very nervous person he well and he was younger.  Hypertension: He is taking medications as directed with no adverse effects. However he states that the Bystolic cost $40 per month.  Hyperlipidemia: Still taking Zocor. No myalgias or other adverse effects.  Hyperglycemia: He has reported that he has made very little changes in his diet.  Alcohol use: He says that he really has not cut back on this any further since the last visit. Still drinking 4 beers every night. Drinks an 18 pack over a weekend. He agrees that he uses alcohol to self treat his anxiety. Says it helps him to calm down.       Past Medical History  Diagnosis Date  . Hyperlipidemia   . Hypertension   . Gout   . Alcohol abuse   . Anxiety   . Hyperglycemia   . Family history of premature coronary artery disease      Home Meds: See attached medication section for current medication list. Any medications entered into computer today will not appear on this note's list. The medications listed below were entered prior to today. Current Outpatient Prescriptions on File Prior to Visit    Medication Sig Dispense Refill  . allopurinol (ZYLOPRIM) 300 MG tablet Take 1 tablet (300 mg total) by mouth daily.  30 tablet  5  . amLODipine (NORVASC) 10 MG tablet Take 10 mg by mouth daily.      . benazepril (LOTENSIN) 40 MG tablet Take 1 tablet (40 mg total) by mouth daily.  30 tablet  5  . clonazePAM (KLONOPIN) 0.5 MG tablet TAKE 1 TABLET TWICE A DAY AS NEEDED  60 tablet  2  . DULoxetine (CYMBALTA) 60 MG capsule TAKE 1 CAPSULE (60 MG TOTAL) BY MOUTH DAILY.  30 capsule  0  . indomethacin (INDOCIN) 50 MG capsule Take 1 capsule (50 mg total) by mouth 3 (three) times daily with meals. PRN gout flare up  60 capsule  0  . Multiple Vitamin (MULTIVITAMIN) tablet Take 1 tablet by mouth daily. Centrum      . simvastatin (ZOCOR) 10 MG tablet Take 1 tablet (10 mg total) by mouth at bedtime.  30 tablet  5   No current facility-administered medications on file prior to visit.    Allergies:  Allergies  Allergen Reactions  . Celexa [Citalopram Hydrobromide]     "Moody", "Change in personality", "Agitated"  . Zoloft [Sertraline Hcl]     "Just didn't feel right" "confused, couldn't think clearly"    History   Social History  . Marital Status: Single    Spouse Name: N/A  Number of Children: N/A  . Years of Education: N/A   Occupational History  . Not on file.   Social History Main Topics  . Smoking status: Never Smoker   . Smokeless tobacco: Not on file  . Alcohol Use: Yes  . Drug Use: Not on file  . Sexual Activity: Not on file   Other Topics Concern  . Not on file   Social History Narrative   Divorced. Lives alone.   Works as Personnel officer.    Family History  Problem Relation Age of Onset  . Heart disease Mother   . Hyperlipidemia Mother   . Hypertension Father   . Stroke Father   . Hypertension Sister   . Heart disease Brother      Review of Systems:  See HPI for pertinent ROS. All other ROS negative.    Physical Exam: Blood pressure 138/92, pulse 84, temperature  98.5 F (36.9 C), temperature source Oral, resp. rate 18, weight 206 lb (93.441 kg)., Body mass index is 30.41 kg/(m^2). General: Overweight white male .Appears in no acute distress. Neck: Supple. No thyromegaly. No lymphadenopathy.  No carotid bruits. Lungs: Clear bilaterally to auscultation without wheezes, rales, or rhonchi. Breathing is unlabored. Heart: RRR with S1 S2. No murmurs, rubs, or gallops. Abdomen: Soft, non-tender, non-distended with normoactive bowel sounds. No hepatomegaly. No rebound/guarding. No obvious abdominal masses. Musculoskeletal:  Strength and tone normal for age. Extremities/Skin: Warm and dry. No clubbing or cyanosis. No edema. No rashes or suspicious lesions. Neuro: Alert and oriented X 3. Moves all extremities spontaneously. Gait is normal. CNII-XII grossly in tact. Psych:  Responds to questions appropriately. Does appear nervous and anxious.      ASSESSMENT AND PLAN:  57 y.o. year old male with  1. GAD (generalized anxiety disorder) We'll try increased dose of Cymbalta. He says that the Clonopin to work. He takes 1 or 2 per day when he gets home from work. - DULoxetine (CYMBALTA) 30 MG capsule; Take 1 daily. Take in addition to 60mg  for total of 90 mg.  Dispense: 30 capsule; Refill: 3 - DULoxetine (CYMBALTA) 60 MG capsule; Take 1 daily. Take in addition to 30mg  for total of 90 mg daily.  Dispense: 30 capsule; Refill: 3  2. Alcohol abuse, daily use At last office visit on 03/05/13 he reported that his toes on both feet feel numb and tingly at night and early in the morning. I did extensive Lab panel and all that was all normal. The lab was unable to run vitamin B1/ thiamin at that time because it requires a special tube in special conditions. Therefore we'll obtain this today. After the visit the lab informed me that this requires to be frozen and they did not realize to do this. Therefore the lab could not be run. The lab was to contact the patient to have him  come back in to check this. - Vitamin B1 - Vitamin B1; Future  3. Hyperlipidemia FLP/LFT was done on 03/05/13. We'll wait to repeat this.  4. Hypertension Secondary to cost, will stop Bystolic and change this to atenolol. Continue all other blood pressure medications the same. - atenolol (TENORMIN) 50 MG tablet; Take 1 tablet (50 mg total) by mouth daily.  Dispense: 90 tablet; Refill: 3  5. Gout Currently controlled. Uric acid was obtained at last lab and was normal range.  6. Hyperglycemia 03/05/13 A1c was 5.5. We'll continue to monitor this.  7. Family history of premature coronary artery disease Brother recently died of  MI at age 21. I discussed with him obtaining a Cardiolite. He has deferred. He was having no angina symptoms. I have informed him of angina symptoms and to seek medical evaluation immediately if he does develop such symptoms.  8. greening colonoscopy: He has never had. Discussed the risk and benefits but he repeatedly defers. Also defers Hemoccult testing.  9. Immunizations: Tetanus is up-to-date per patient. I discussed influenza vaccine with him at his visit 10/2/for 14. He deferred.  I will further discuss some type of alcohol rehabilitation program with him at his next visit. We'll discuss AA and other options. We'll also consider adding TriCor to treat hypertriglyceridemia to avoid pancreatitis. He is to have followup office visit in 3 months or sooner if needed.   [name] [place], [place], [facility] 05/26/2013 6:06 PM

## 2013-06-11 ENCOUNTER — Other Ambulatory Visit: Payer: Self-pay | Admitting: Physician Assistant

## 2013-06-11 NOTE — Telephone Encounter (Signed)
Last RF 10/2 #60 + 2  Last OV 12/22  OK refill?

## 2013-06-12 NOTE — Telephone Encounter (Signed)
rx called in

## 2013-06-12 NOTE — Telephone Encounter (Signed)
Approved for #60+2 

## 2013-06-26 ENCOUNTER — Other Ambulatory Visit: Payer: Self-pay | Admitting: Physician Assistant

## 2013-06-26 NOTE — Telephone Encounter (Signed)
Medication refilled per protocol. 

## 2013-08-05 ENCOUNTER — Other Ambulatory Visit: Payer: Self-pay | Admitting: Physician Assistant

## 2013-08-05 NOTE — Telephone Encounter (Signed)
Refill appropriate and filled per protocol. 

## 2013-08-24 ENCOUNTER — Ambulatory Visit: Payer: PRIVATE HEALTH INSURANCE | Admitting: Physician Assistant

## 2013-08-29 ENCOUNTER — Other Ambulatory Visit: Payer: Self-pay | Admitting: Physician Assistant

## 2013-08-31 NOTE — Telephone Encounter (Signed)
Medication refilled per protocol. 

## 2013-09-07 ENCOUNTER — Ambulatory Visit (INDEPENDENT_AMBULATORY_CARE_PROVIDER_SITE_OTHER): Payer: PRIVATE HEALTH INSURANCE | Admitting: Physician Assistant

## 2013-09-07 ENCOUNTER — Other Ambulatory Visit: Payer: Self-pay | Admitting: Family Medicine

## 2013-09-07 ENCOUNTER — Encounter: Payer: Self-pay | Admitting: Physician Assistant

## 2013-09-07 VITALS — BP 128/84 | HR 72 | Temp 98.1°F | Resp 18 | Wt 199.0 lb

## 2013-09-07 DIAGNOSIS — I10 Essential (primary) hypertension: Secondary | ICD-10-CM

## 2013-09-07 DIAGNOSIS — F411 Generalized anxiety disorder: Secondary | ICD-10-CM

## 2013-09-07 DIAGNOSIS — N529 Male erectile dysfunction, unspecified: Secondary | ICD-10-CM | POA: Diagnosis not present

## 2013-09-07 DIAGNOSIS — Z8249 Family history of ischemic heart disease and other diseases of the circulatory system: Secondary | ICD-10-CM | POA: Diagnosis not present

## 2013-09-07 DIAGNOSIS — M109 Gout, unspecified: Secondary | ICD-10-CM | POA: Diagnosis not present

## 2013-09-07 DIAGNOSIS — F1010 Alcohol abuse, uncomplicated: Secondary | ICD-10-CM | POA: Diagnosis not present

## 2013-09-07 DIAGNOSIS — R7309 Other abnormal glucose: Secondary | ICD-10-CM | POA: Diagnosis not present

## 2013-09-07 DIAGNOSIS — R739 Hyperglycemia, unspecified: Secondary | ICD-10-CM

## 2013-09-07 DIAGNOSIS — E785 Hyperlipidemia, unspecified: Secondary | ICD-10-CM | POA: Diagnosis not present

## 2013-09-07 LAB — COMPLETE METABOLIC PANEL WITH GFR
ALK PHOS: 60 U/L (ref 39–117)
ALT: 57 U/L — AB (ref 0–53)
AST: 52 U/L — ABNORMAL HIGH (ref 0–37)
Albumin: 4.6 g/dL (ref 3.5–5.2)
BILIRUBIN TOTAL: 0.4 mg/dL (ref 0.2–1.2)
BUN: 13 mg/dL (ref 6–23)
CO2: 25 mEq/L (ref 19–32)
Calcium: 9.7 mg/dL (ref 8.4–10.5)
Chloride: 104 mEq/L (ref 96–112)
Creat: 0.97 mg/dL (ref 0.50–1.35)
GFR, Est African American: >89 mL/min
GFR, Est Non African American: 86 mL/min
Glucose, Bld: 72 mg/dL (ref 70–99)
Potassium: 4.2 mEq/L (ref 3.5–5.3)
SODIUM: 142 meq/L (ref 135–145)
Total Protein: 7.5 g/dL (ref 6.0–8.3)

## 2013-09-07 LAB — VITAMIN B1

## 2013-09-07 LAB — HEMOGLOBIN A1C
Hgb A1c MFr Bld: 5.6 % (ref <5.7)
MEAN PLASMA GLUCOSE: 114 mg/dL (ref <117)

## 2013-09-07 MED ORDER — SILDENAFIL CITRATE 100 MG PO TABS: 50.0000 mg | ORAL_TABLET | Freq: Every day | ORAL | Status: DC | PRN

## 2013-09-07 MED ORDER — AMLODIPINE BESYLATE 10 MG PO TABS: 10.0000 mg | ORAL_TABLET | Freq: Every day | ORAL | Status: DC

## 2013-09-07 MED ORDER — SIMVASTATIN 10 MG PO TABS: 10.0000 mg | ORAL_TABLET | Freq: Every day | ORAL | Status: DC

## 2013-09-07 MED ORDER — DULOXETINE HCL 30 MG PO CPEP: ORAL_CAPSULE | ORAL | Status: DC

## 2013-09-07 MED ORDER — ATENOLOL 50 MG PO TABS: 50.0000 mg | ORAL_TABLET | Freq: Every day | ORAL | Status: DC

## 2013-09-07 MED ORDER — DULOXETINE HCL 60 MG PO CPEP: ORAL_CAPSULE | ORAL | Status: DC

## 2013-09-07 MED ORDER — BENAZEPRIL HCL 40 MG PO TABS: 40.0000 mg | ORAL_TABLET | Freq: Every day | ORAL | Status: DC

## 2013-09-07 MED ORDER — ALLOPURINOL 300 MG PO TABS: 300.0000 mg | ORAL_TABLET | Freq: Every day | ORAL | Status: DC

## 2013-09-07 NOTE — Telephone Encounter (Signed)
Medication refilled per protocol. 

## 2013-09-07 NOTE — Progress Notes (Signed)
Patient ID: Orien Mayhall MRN: [id number], DOB: 25-Jun-1955, 58 y.o. Date of Encounter: @DATE @  Chief Complaint:  Chief Complaint  Patient presents with  . 3 mth check up    not fasting - had energy drink    HPI: 58 y.o. year old male  presents routine followup.  Anxiety: In past I have treated him with both Zoloft and Celexa -- he had adverse effects with both of these. He is currently being treated with Cymbalta which is finally working and not causing adverse effects. At his last visit with me in 05/25/13 he was on Cymbalta 60 mg.At  That point he reported that his anxiety and nervousness had decreased somewhat with this medication. At that time I increased the dose to 90 mg. He is taking a 60 mg tablet in addition to a 30 mg. Today he says that he is feeling much better. He can notice a huge improvement and feels much calmer.  Hypertension: Last visit he reported that Bystolic was costing $34 per month. Therefore we stopped the Bystolic and change to atenolol 50 mg. Today he reports he is taking the atenolol daily as directed.  Hyper lipidemia: He is still taking the Zocor. No myalgias or other adverse effects.  Hyperglycemia: Has made very little changes in his diet.  Alcohol use: Last visit he said he really had not cut back on this since his prior visit that we have discussed trying to cut back. At that time he was treating 4 beers every night. Was drinking an 18 pack beer over the weekend. Today he reports that his alcohol intake has decreased recently but it has only been because of his work schedule--he  has been having to work more.  His only complaint today is erectile dysfunction. He says that this has started since his last visit and is wondering if it is secondary to some of the medicines. He is not married but says he does have a girlfriend.Lives alone.   Past Medical History  Diagnosis Date  . Hyperlipidemia   . Hypertension   . Gout   . Alcohol abuse   .  Anxiety   . Hyperglycemia   . Family history of premature coronary artery disease      Home Meds: See attached medication section for current medication list. Any medications entered into computer today will not appear on this note's list. The medications listed below were entered prior to today. Current Outpatient Prescriptions on File Prior to Visit  Medication Sig Dispense Refill  . clonazePAM (KLONOPIN) 0.5 MG tablet TAKE 1 TABLET TWICE A DAY AS NEEDED  60 tablet  2  . indomethacin (INDOCIN) 50 MG capsule TAKE ONE CAPSULE 3 TIMES A DAY AS NEEDED WITH MEAL  60 capsule  0  . Multiple Vitamin (MULTIVITAMIN) tablet Take 1 tablet by mouth daily. Centrum       No current facility-administered medications on file prior to visit.    Allergies:  Allergies  Allergen Reactions  . Celexa [Citalopram Hydrobromide]     "Moody", "Change in personality", "Agitated"  . Zoloft [Sertraline Hcl]     "Just didn't feel right" "confused, couldn't think clearly"    History   Social History  . Marital Status: Single    Spouse Name: N/A    Number of Children: N/A  . Years of Education: N/A   Occupational History  . Not on file.   Social History Main Topics  . Smoking status: Never Smoker   . Smokeless  tobacco: Current User    Types: Snuff  . Alcohol Use: Yes  . Drug Use: Not on file  . Sexual Activity: Not on file   Other Topics Concern  . Not on file   Social History Narrative   Divorced. Lives alone.   Works as Clinical biochemist.    Family History  Problem Relation Age of Onset  . Heart disease Mother   . Hyperlipidemia Mother   . Hypertension Father   . Stroke Father   . Hypertension Sister   . Heart disease Brother      Review of Systems:  See HPI for pertinent ROS. All other ROS negative.    Physical Exam: Blood pressure 128/84, pulse 72, temperature 98.1 F (36.7 C), temperature source Oral, resp. rate 18, weight 199 lb (90.266 kg)., Body mass index is 29.37  kg/(m^2). General: WNWD WM. Appears in no acute distress. Neck: Supple. No thyromegaly. No lymphadenopathy. No Carotid bruit Lungs: Clear bilaterally to auscultation without wheezes, rales, or rhonchi. Breathing is unlabored. Heart: RRR with S1 S2. No murmurs, rubs, or gallops. Abdomen: Soft, non-tender, non-distended with normoactive bowel sounds. No hepatomegaly. No rebound/guarding. No obvious abdominal masses. Musculoskeletal:  Strength and tone normal for age. Extremities/Skin: Warm and dry. No clubbing or cyanosis. No edema. No rashes or suspicious lesions. Neuro: Alert and oriented X 3. Moves all extremities spontaneously. Gait is normal. CNII-XII grossly in tact. Psych:  Responds to questions appropriately with a normal affect. Does appear less nervous and less anxious today.      ASSESSMENT AND PLAN:  58 y.o. year old male with  1. Erectile dysfunction Discussed that the erectile dysfunction could be secondary to the Cymbalta or the atenolol. I really do not want to stop the Cymbalta. In The past he had adverse effects of Zoloft and Celexa. Finally the Cymbalta is working and causing no adverse effects. Asol, I really do not want to stop the atenolol. Do not have many options for his blood pressure . Could not use Bystolic sec to cost.  HCTZ can also cause ED He is on Benazepril 40 mg which is max dose. He is on Norvasc 10 mg which is max dose. - sildenafil (VIAGRA) 100 MG tablet; Take 0.5-1 tablets (50-100 mg total) by mouth daily as needed for erectile dysfunction.  Dispense: 3 tablet; Refill: 11  2. GAD (generalized anxiety disorder) Continue Cymbalta 90 mg with 60+30 mg tablets together.  3. Alcohol abuse Discussed that  while he has decreased his amount of alcohol secondary to his work schedule, to please try to keep the amount decreased. Also discussed with him going to alcoholics anonymous going to some type of rehabilitation program. He is not interested at all. Says   he "has been drinking all of his life.--and that's just the way I am'" - Vitamin B1  Office visit 03/05/13 he reported that his toes on both feet felt numb and tingly at night and early in the morning. I did extensive lab panel was all normal. I was unable to line 80 thiamine because it requires specialty with special conditions. Obtain this now. Patient states that the tingling in his feet has resolved.  4. Family history of premature coronary artery disease Brother died of MI at age 54. Discussed with patient obtaining a Cardiolite-- he has deferred. He is still having no angina symptoms even with exertion. I've informed him of angina symptoms and seek medical evaluation immediately if he does develop such symptoms.  5. Hypertension Blood pressure  well controlled. Continue current medicines. Check lab to monitor. - COMPLETE METABOLIC PANEL WITH GFR  6. Hyperlipidemia He is not fasting. He is on simvastatin. I asked FLP/LFT was 03/05/13. - COMPLETE METABOLIC PANEL WITH GFR  7. Hyperglycemia - COMPLETE METABOLIC PANEL WITH GFR - Hemoglobin A1c  8. Gout Currently controlled. No recent flares. Uric Acid level 4.9 at lab 03/05/13.  9. colorectal cancer screening: He has never had screening colonoscopy. I discussed risks benefits and me he repeatedly defers. Also discussed Hemoccult testing but he is deferred.  10. Prostate cancer screening: Screening PSA was done in 03/05/13 normal.  11. Immunizations: Tetanus:  is up to date per patient. Discuss pneumonia vaccine with him at his next visit-- given his alcohol use this meets criteria as intermediate risk and he NEEDS PNEUMOVAX 23--DISCUSS AT NEXT OV  . ROV 66mos, or sooner if needed.   Marin Olp [place], [place], [facility] 09/07/2013 2:19 PM

## 2013-09-09 ENCOUNTER — Ambulatory Visit: Payer: PRIVATE HEALTH INSURANCE | Admitting: Physician Assistant

## 2013-10-15 ENCOUNTER — Other Ambulatory Visit: Payer: Self-pay | Admitting: Physician Assistant

## 2013-10-16 ENCOUNTER — Other Ambulatory Visit: Payer: Self-pay | Admitting: Physician Assistant

## 2013-10-16 NOTE — Telephone Encounter (Signed)
Last RF 06/11/13 #60 + 2.  Last OV 09/07/13  OK refill?

## 2013-10-16 NOTE — Telephone Encounter (Signed)
Approved for #60+2 

## 2013-10-16 NOTE — Telephone Encounter (Signed)
Medication refilled per protocol. 

## 2013-10-16 NOTE — Telephone Encounter (Signed)
RX called in .

## 2013-10-20 ENCOUNTER — Other Ambulatory Visit: Payer: Self-pay | Admitting: Physician Assistant

## 2014-04-24 ENCOUNTER — Other Ambulatory Visit: Payer: Self-pay | Admitting: Physician Assistant

## 2014-04-26 ENCOUNTER — Other Ambulatory Visit: Payer: Self-pay | Admitting: Physician Assistant

## 2014-04-26 ENCOUNTER — Encounter: Payer: Self-pay | Admitting: Family Medicine

## 2014-04-26 NOTE — Telephone Encounter (Signed)
Medication refill for one time only.  Patient needs to be seen.  Letter sent for patient to call and schedule 

## 2014-04-27 NOTE — Telephone Encounter (Signed)
Medication refill for one time only.  Patient needs to be seen.  Letter sent for patient to call and schedule 

## 2014-05-03 ENCOUNTER — Other Ambulatory Visit: Payer: Self-pay | Admitting: Physician Assistant

## 2014-05-04 NOTE — Telephone Encounter (Signed)
Medication refill for one time only.  Patient needs to be seen.  Letter sent for patient to call and schedule 

## 2014-05-18 ENCOUNTER — Other Ambulatory Visit: Payer: Self-pay | Admitting: Physician Assistant

## 2014-05-19 NOTE — Telephone Encounter (Signed)
Refill denied .  Pt NTBS and has been notified.  Has not made appt.

## 2014-05-22 ENCOUNTER — Other Ambulatory Visit: Payer: Self-pay | Admitting: Physician Assistant

## 2014-05-24 NOTE — Telephone Encounter (Signed)
Medication refill for one time only.  Patient needs to be seen.  Letter sent for patient to call and schedule 

## 2014-05-26 ENCOUNTER — Other Ambulatory Visit: Payer: Self-pay | Admitting: Physician Assistant

## 2014-05-26 NOTE — Telephone Encounter (Signed)
Refill appropriate and filled per protocol. 

## 2014-06-08 ENCOUNTER — Other Ambulatory Visit: Payer: Self-pay | Admitting: Physician Assistant

## 2014-06-08 NOTE — Telephone Encounter (Signed)
One refill only.  Pt NTBS.  Has appt 06/10/14.

## 2014-06-10 ENCOUNTER — Ambulatory Visit (INDEPENDENT_AMBULATORY_CARE_PROVIDER_SITE_OTHER): Payer: BLUE CROSS/BLUE SHIELD | Admitting: Physician Assistant

## 2014-06-10 ENCOUNTER — Other Ambulatory Visit: Payer: Self-pay | Admitting: Family Medicine

## 2014-06-10 ENCOUNTER — Encounter: Payer: Self-pay | Admitting: Family Medicine

## 2014-06-10 ENCOUNTER — Encounter: Payer: Self-pay | Admitting: Physician Assistant

## 2014-06-10 VITALS — BP 130/82 | HR 76 | Temp 98.3°F | Resp 18 | Ht 69.5 in | Wt 176.0 lb

## 2014-06-10 DIAGNOSIS — E785 Hyperlipidemia, unspecified: Secondary | ICD-10-CM

## 2014-06-10 DIAGNOSIS — M1000 Idiopathic gout, unspecified site: Secondary | ICD-10-CM

## 2014-06-10 DIAGNOSIS — Z8249 Family history of ischemic heart disease and other diseases of the circulatory system: Secondary | ICD-10-CM | POA: Diagnosis not present

## 2014-06-10 DIAGNOSIS — R739 Hyperglycemia, unspecified: Secondary | ICD-10-CM

## 2014-06-10 DIAGNOSIS — I10 Essential (primary) hypertension: Secondary | ICD-10-CM

## 2014-06-10 DIAGNOSIS — F411 Generalized anxiety disorder: Secondary | ICD-10-CM | POA: Diagnosis not present

## 2014-06-10 DIAGNOSIS — Z23 Encounter for immunization: Secondary | ICD-10-CM

## 2014-06-10 DIAGNOSIS — F419 Anxiety disorder, unspecified: Secondary | ICD-10-CM

## 2014-06-10 DIAGNOSIS — F1010 Alcohol abuse, uncomplicated: Secondary | ICD-10-CM | POA: Diagnosis not present

## 2014-06-10 DIAGNOSIS — Z125 Encounter for screening for malignant neoplasm of prostate: Secondary | ICD-10-CM

## 2014-06-10 MED ORDER — SILDENAFIL CITRATE 100 MG PO TABS: ORAL_TABLET | ORAL | Status: DC

## 2014-06-10 MED ORDER — AMLODIPINE BESYLATE 10 MG PO TABS: 10.0000 mg | ORAL_TABLET | Freq: Every day | ORAL | Status: DC

## 2014-06-10 MED ORDER — SIMVASTATIN 10 MG PO TABS: ORAL_TABLET | ORAL | Status: DC

## 2014-06-10 MED ORDER — DULOXETINE HCL 60 MG PO CPEP: 60.0000 mg | ORAL_CAPSULE | Freq: Every day | ORAL | Status: DC

## 2014-06-10 MED ORDER — DULOXETINE HCL 30 MG PO CPEP: 30.0000 mg | ORAL_CAPSULE | Freq: Every day | ORAL | Status: DC

## 2014-06-10 MED ORDER — ATENOLOL 50 MG PO TABS: 50.0000 mg | ORAL_TABLET | Freq: Every day | ORAL | Status: DC

## 2014-06-10 MED ORDER — BENAZEPRIL HCL 40 MG PO TABS: 40.0000 mg | ORAL_TABLET | Freq: Every day | ORAL | Status: DC

## 2014-06-10 NOTE — Telephone Encounter (Signed)
Medication refilled per protocol. 

## 2014-06-10 NOTE — Progress Notes (Signed)
Patient ID: Jonathan Hodges MRN: [id number], DOB: 09-23-1955, 59 y.o. Date of Encounter: @DATE @  Chief Complaint:  Chief Complaint  Patient presents with  . 6 mth check up    is fasting    HPI: 59 y.o. year old male  presents routine followup.  Anxiety: In past I have treated him with both Zoloft and Celexa -- he had adverse effects with both of these. He is currently being treated with Cymbalta which is finally working and not causing adverse effects. At his last visit with me in 05/25/13 he was on Cymbalta 60 mg.At  That point he reported that his anxiety and nervousness had decreased somewhat with this medication. At that time I increased the dose to 90 mg. He is taking a 60 mg tablet in addition to a 30 mg. Today he says that he is feeling much better. He can notice a huge improvement and feels much calmer.  Hypertension: In past he reported that Bystolic was costing $66 per month. Therefore we stopped the Bystolic and change to atenolol 50 mg. Today he reports he is taking the atenolol daily as directed.  Hyper lipidemia: AT 06/2014 OV --he says that he has been out of his cholesterol medication for one month. Says that it was hard for him to get in for an office visit and refills ran out.  Hyperglycemia: Has made very little changes in his diet.  Alcohol use: Last visit he said he really had not cut back on this since his prior visit that we have discussed trying to cut back. At that time he was treating 4 beers every night. Was drinking an 18 pack beer over the weekend. 09/2013-- he reports that his alcohol intake has decreased recently but it has only been because of his work schedule--he  has been having to work more. 06/2014--- says that a week night he usually drinks about 2-3 beers. Over a weekend-- usually about a 12 pack. Says  alcohol intake definitely has continued to decrease.  Erectile dysfunction--He c/o of this at [place] 09/2013. The following is copied from htat note:Marland Kitchen He  says that this has started since his last visit and is wondering if it is secondary to some of the medicines. He is not married but says he does have a girlfriend.Lives alone.  Says he has had no gout flare.   Past Medical History  Diagnosis Date  . Hyperlipidemia   . Hypertension   . Gout   . Alcohol abuse   . Anxiety   . Hyperglycemia   . Family history of premature coronary artery disease      Home Meds:  Outpatient Prescriptions Prior to Visit  Medication Sig Dispense Refill  . amLODipine (NORVASC) 10 MG tablet TAKE 1 TABLET BY MOUTH EVERY DAY 30 tablet 0  . atenolol (TENORMIN) 50 MG tablet TAKE 1 TABLET BY MOUTH EVERY DAY 30 tablet 0  . benazepril (LOTENSIN) 40 MG tablet TAKE 1 TABLET BY MOUTH EVERY DAY 30 tablet 0  . DULoxetine (CYMBALTA) 30 MG capsule TAKE 1 CAPSULE EVERY DAY 30 capsule 0  . DULoxetine (CYMBALTA) 60 MG capsule TAKE 1 CAPSULE EVERY DAY 30 capsule 0  . Multiple Vitamin (MULTIVITAMIN) tablet Take 1 tablet by mouth daily. Centrum    . VIAGRA 100 MG tablet TAKE 1/2 TO 1 TABLET BY MOUTH DAILY AS NEEDED FOR ERECTILE DYSFUNCTION. 3 tablet 10  . allopurinol (ZYLOPRIM) 300 MG tablet TAKE 1 TABLET BY MOUTH EVERY DAY (Patient not taking: Reported  on 06/10/2014) 30 tablet 0  . clonazePAM (KLONOPIN) 0.5 MG tablet TAKE 1 TABLET TWICE A DAY AS NEEDED (Patient not taking: Reported on 06/10/2014) 60 tablet 0  . indomethacin (INDOCIN) 50 MG capsule TAKE ONE CAPSULE 3 TIMES A DAY AS NEEDED WITH MEAL (Patient not taking: Reported on 06/10/2014) 60 capsule 0  . simvastatin (ZOCOR) 10 MG tablet TAKE 1 TABLET (10 MG TOTAL) BY MOUTH AT BEDTIME. (Patient not taking: Reported on 06/10/2014) 30 tablet 3   No facility-administered medications prior to visit.     Allergies:  Allergies  Allergen Reactions  . Celexa [Citalopram Hydrobromide]     "Moody", "Change in personality", "Agitated"  . Zoloft [Sertraline Hcl]     "Just didn't feel right" "confused, couldn't think clearly"     History   Social History  . Marital Status: Single    Spouse Name: N/A    Number of Children: N/A  . Years of Education: N/A   Occupational History  . Not on file.   Social History Main Topics  . Smoking status: Never Smoker   . Smokeless tobacco: Current User    Types: Snuff  . Alcohol Use: Yes  . Drug Use: Not on file  . Sexual Activity: Not on file   Other Topics Concern  . Not on file   Social History Narrative   Divorced. Lives alone.   Works as Clinical biochemist.    Family History  Problem Relation Age of Onset  . Heart disease Mother   . Hyperlipidemia Mother   . Hypertension Father   . Stroke Father   . Hypertension Sister   . Heart disease Brother      Review of Systems:  See HPI for pertinent ROS. All other ROS negative.    Physical Exam: Blood pressure 130/82, pulse 76, temperature 98.3 F (36.8 C), temperature source Oral, resp. rate 18, height 5' 9.5" (1.765 m), weight 176 lb (79.833 kg)., Body mass index is 25.63 kg/(m^2). General: WNWD WM. Appears in no acute distress. Neck: Supple. No thyromegaly. No lymphadenopathy. No Carotid bruit Lungs: Clear bilaterally to auscultation without wheezes, rales, or rhonchi. Breathing is unlabored. Heart: RRR with S1 S2. No murmurs, rubs, or gallops. Abdomen: Soft, non-tender, non-distended with normoactive bowel sounds. No hepatomegaly. No rebound/guarding. No obvious abdominal masses. Musculoskeletal:  Strength and tone normal for age. Extremities/Skin: Warm and dry. No clubbing or cyanosis. No edema. No rashes or suspicious lesions. Neuro: Alert and oriented X 3. Moves all extremities spontaneously. Gait is normal. CNII-XII grossly in tact. Psych:  Responds to questions appropriately with a normal affect. Does appear less nervous and less anxious today.      ASSESSMENT AND PLAN:  59 y.o. year old male with  1. Erectile dysfunction The following is copied from OV Note 09/2013. : Discussed that the erectile  dysfunction could be secondary to the Cymbalta or the atenolol. I really do not want to stop the Cymbalta. In The past he had adverse effects of Zoloft and Celexa. Finally the Cymbalta is working and causing no adverse effects. Asol, I really do not want to stop the atenolol. Do not have many options for his blood pressure . Could not use Bystolic sec to cost.  HCTZ can also cause ED He is on Benazepril 40 mg which is max dose. He is on Norvasc 10 mg which is max dose. - sildenafil (VIAGRA) 100 MG tablet; Take 0.5-1 tablets (50-100 mg total) by mouth daily as needed for erectile dysfunction.  Dispense:  3 tablet; Refill: 11  2. GAD (generalized anxiety disorder) Continue Cymbalta 90 mg with 60+30 mg tablets together.  3. Alcohol abuse In Past:  Discussed that  while he has decreased his amount of alcohol secondary to his work schedule, to please try to keep the amount decreased. Also discussed with him going to alcoholics anonymous going to some type of rehabilitation program. He is not interested at all. Says  he "has been drinking all of his life.--and that's just the way I am'"  06/2014: Pt says he has decreased alcohol intake a lot.   Office visit 03/05/13 he reported that his toes on both feet felt numb and tingly at night and early in the morning. I did extensive lab panel was all normal. I was unable to line 80 thiamine because it requires specialty with special conditions. Obtain this now. Patient states that the tingling in his feet has resolved.  4. Family history of premature coronary artery disease Brother died of MI at age 52. Discussed with patient obtaining a Cardiolite-- he has deferred. He is still having no angina symptoms even with exertion. I've informed him of angina symptoms and seek medical evaluation immediately if he does develop such symptoms.  5. Hypertension Blood pressure well controlled. Continue current medicines. Check lab to monitor. - COMPLETE METABOLIC  PANEL WITH GFR  6. Hyperlipidemia At OV 06/2014: Says that he has been out of his cholesterol medication for about one month. I told him to restart this and then come back for fasting labs in 6 weeks.  7. Hyperglycemia - COMPLETE METABOLIC PANEL WITH GFR - Hemoglobin A1c  8. Gout Currently controlled. No recent flares. Uric Acid level 4.9 at lab 03/05/13.  9. colorectal cancer screening: He has never had screening colonoscopy. I discussed risks benefits and me he repeatedly defers. Also discussed Hemoccult testing but he is deferred.  10. Prostate cancer screening: Screening PSA was done in 03/05/13 normal.  11. Immunizations: Tetanus:  Patient not certain of date of last tetanus. Says it is probably close to 10 years and he is agreeable to update this today.Tdap given here 06/10/2014 Pneumonia vaccine: Given his chronic alcohol use this meets indication for Pneumovax 23. Recommended he receive this at visit 06/10/2008 he was agreeable. Pneumovax 23 given here 06/10/2014.   No further pneumonia vaccine indicated until age 47. Zostavax--will discuss at age 32   He is to restart his statin. Then return for fasting labs in 6 weeks. Routine follow-up office visit 6 months or sooner if needed.   Signed, [address] [place], [place], [facility] 06/10/2014 9:10 AM

## 2014-07-22 ENCOUNTER — Other Ambulatory Visit: Payer: Self-pay | Admitting: Physician Assistant

## 2014-07-22 NOTE — Telephone Encounter (Signed)
Refill appropriate and filled per protocol. 

## 2014-07-23 NOTE — Telephone Encounter (Signed)
Medication refilled per protocol. 

## 2014-08-10 NOTE — Progress Notes (Signed)
Patient had office visit with me 06/10/14. At that time he was to return fasting for labs. Future labs were ordered. They are now showing up as "Overdue Results" Folder--- please call patient and remind him to come in fasting for lab work THIS week.

## 2014-09-09 ENCOUNTER — Other Ambulatory Visit: Payer: Self-pay | Admitting: Physician Assistant

## 2014-09-10 NOTE — Telephone Encounter (Signed)
Medication refilled per protocol. 

## 2014-10-04 ENCOUNTER — Other Ambulatory Visit: Payer: Self-pay | Admitting: Physician Assistant

## 2014-10-04 NOTE — Telephone Encounter (Signed)
Refill appropriate and filled per protocol. 

## 2014-12-26 ENCOUNTER — Encounter (HOSPITAL_COMMUNITY): Payer: Self-pay | Admitting: Emergency Medicine

## 2014-12-26 ENCOUNTER — Observation Stay (HOSPITAL_COMMUNITY)
Admission: EM | Admit: 2014-12-26 | Discharge: 2014-12-27 | Disposition: A | Discharge status: Other Acute Inpatient Hospital | Payer: BLUE CROSS/BLUE SHIELD | Attending: Cardiovascular Disease | Admitting: Cardiovascular Disease

## 2014-12-26 ENCOUNTER — Emergency Department (HOSPITAL_COMMUNITY): Payer: BLUE CROSS/BLUE SHIELD

## 2014-12-26 DIAGNOSIS — E785 Hyperlipidemia, unspecified: Secondary | ICD-10-CM | POA: Insufficient documentation

## 2014-12-26 DIAGNOSIS — F419 Anxiety disorder, unspecified: Secondary | ICD-10-CM | POA: Diagnosis present

## 2014-12-26 DIAGNOSIS — R079 Chest pain, unspecified: Principal | ICD-10-CM | POA: Insufficient documentation

## 2014-12-26 DIAGNOSIS — R0789 Other chest pain: Secondary | ICD-10-CM | POA: Diagnosis not present

## 2014-12-26 DIAGNOSIS — I319 Disease of pericardium, unspecified: Secondary | ICD-10-CM

## 2014-12-26 DIAGNOSIS — Z8249 Family history of ischemic heart disease and other diseases of the circulatory system: Secondary | ICD-10-CM

## 2014-12-26 DIAGNOSIS — F1010 Alcohol abuse, uncomplicated: Secondary | ICD-10-CM | POA: Diagnosis not present

## 2014-12-26 DIAGNOSIS — I10 Essential (primary) hypertension: Secondary | ICD-10-CM | POA: Diagnosis present

## 2014-12-26 LAB — COMPREHENSIVE METABOLIC PANEL
ALK PHOS: 49 U/L (ref 38–126)
ALT: 29 U/L (ref 17–63)
ANION GAP: 11 (ref 5–15)
AST: 51 U/L — ABNORMAL HIGH (ref 15–41)
Albumin: 3.7 g/dL (ref 3.5–5.0)
BILIRUBIN TOTAL: 0.9 mg/dL (ref 0.3–1.2)
BUN: 9 mg/dL (ref 6–20)
CHLORIDE: 107 mmol/L (ref 101–111)
CO2: 23 mmol/L (ref 22–32)
Calcium: 8.6 mg/dL — ABNORMAL LOW (ref 8.9–10.3)
Creatinine, Ser: 0.81 mg/dL (ref 0.61–1.24)
GFR calc Af Amer: >60 mL/min (ref >60)
Glucose, Bld: 92 mg/dL (ref 65–99)
POTASSIUM: 3.9 mmol/L (ref 3.5–5.1)
Sodium: 141 mmol/L (ref 135–145)
Total Protein: 7.3 g/dL (ref 6.5–8.1)

## 2014-12-26 LAB — CBC WITH DIFFERENTIAL/PLATELET
Basophils Absolute: 0 10*3/uL (ref 0.0–0.1)
Basophils Relative: 1 % (ref 0–1)
EOS ABS: 0.1 10*3/uL (ref 0.0–0.7)
EOS PCT: 1 % (ref 0–5)
HEMATOCRIT: 46.1 % (ref 39.0–52.0)
HEMOGLOBIN: 16.2 g/dL (ref 13.0–17.0)
LYMPHS ABS: 1.7 10*3/uL (ref 0.7–4.0)
LYMPHS PCT: 30 % (ref 12–46)
MCH: 35.1 pg — AB (ref 26.0–34.0)
MCHC: 35.1 g/dL (ref 30.0–36.0)
MCV: 100 fL (ref 78.0–100.0)
MONO ABS: 0.4 10*3/uL (ref 0.1–1.0)
MONOS PCT: 8 % (ref 3–12)
Neutro Abs: 3.4 10*3/uL (ref 1.7–7.7)
Neutrophils Relative %: 60 % (ref 43–77)
Platelets: 168 10*3/uL (ref 150–400)
RBC: 4.61 MIL/uL (ref 4.22–5.81)
RDW: 13.5 % (ref 11.5–15.5)
WBC: 5.6 10*3/uL (ref 4.0–10.5)

## 2014-12-26 LAB — I-STAT TROPONIN, ED: Troponin i, poc: 0 ng/mL (ref 0.00–0.08)

## 2014-12-26 LAB — CREATININE, SERUM
Creatinine, Ser: 0.88 mg/dL (ref 0.61–1.24)
GFR calc non Af Amer: >60 mL/min (ref >60)

## 2014-12-26 LAB — CBC
HEMATOCRIT: 44.8 % (ref 39.0–52.0)
HEMOGLOBIN: 15.4 g/dL (ref 13.0–17.0)
MCH: 34.8 pg — ABNORMAL HIGH (ref 26.0–34.0)
MCHC: 34.4 g/dL (ref 30.0–36.0)
MCV: 101.4 fL — AB (ref 78.0–100.0)
Platelets: 134 10*3/uL — ABNORMAL LOW (ref 150–400)
RBC: 4.42 MIL/uL (ref 4.22–5.81)
RDW: 13.6 % (ref 11.5–15.5)
WBC: 5.5 10*3/uL (ref 4.0–10.5)

## 2014-12-26 LAB — TROPONIN I
Troponin I: <0.03 ng/mL (ref <0.031)
Troponin I: <0.03 ng/mL (ref <0.031)

## 2014-12-26 LAB — D-DIMER, QUANTITATIVE (NOT AT ARMC): D DIMER QUANT: 0.45 ug{FEU}/mL (ref 0.00–0.48)

## 2014-12-26 LAB — TSH: TSH: 1.009 u[IU]/mL (ref 0.350–4.500)

## 2014-12-26 MED ORDER — ENOXAPARIN SODIUM 40 MG/0.4ML ~~LOC~~ SOLN
40.0000 mg | SUBCUTANEOUS | Status: DC
  Administered 2014-12-26 – 2014-12-27 (×2): 40 mg via SUBCUTANEOUS
  Filled 2014-12-26 (×2): qty 0.4

## 2014-12-26 MED ORDER — SODIUM CHLORIDE 0.9 % IJ SOLN: 3.0000 mL | Freq: Two times a day (BID) | INTRAMUSCULAR | Status: DC

## 2014-12-26 MED ORDER — NITROGLYCERIN 0.4 MG SL SUBL: 0.4000 mg | SUBLINGUAL_TABLET | SUBLINGUAL | Status: DC | PRN

## 2014-12-26 MED ORDER — IBUPROFEN 200 MG PO TABS
400.0000 mg | ORAL_TABLET | Freq: Three times a day (TID) | ORAL | Status: DC
Start: 2014-12-26 — End: 2014-12-27
  Administered 2014-12-26 – 2014-12-27 (×4): 400 mg via ORAL
  Filled 2014-12-26 (×4): qty 2

## 2014-12-26 MED ORDER — ONDANSETRON HCL 4 MG/2ML IJ SOLN: 4.0000 mg | Freq: Four times a day (QID) | INTRAMUSCULAR | Status: DC | PRN

## 2014-12-26 MED ORDER — DULOXETINE HCL 30 MG PO CPEP
90.0000 mg | ORAL_CAPSULE | Freq: Every day | ORAL | Status: DC
  Administered 2014-12-26 – 2014-12-27 (×2): 90 mg via ORAL
  Filled 2014-12-26 (×4): qty 1

## 2014-12-26 MED ORDER — NITROGLYCERIN 0.4 MG SL SUBL
0.4000 mg | SUBLINGUAL_TABLET | SUBLINGUAL | Status: DC | PRN
  Administered 2014-12-26 (×2): 0.4 mg via SUBLINGUAL
  Filled 2014-12-26: qty 1

## 2014-12-26 MED ORDER — BENAZEPRIL HCL 40 MG PO TABS
40.0000 mg | ORAL_TABLET | Freq: Every day | ORAL | Status: DC
  Administered 2014-12-27: 40 mg via ORAL
  Filled 2014-12-26 (×2): qty 1

## 2014-12-26 MED ORDER — ACETAMINOPHEN 325 MG PO TABS: 650.0000 mg | ORAL_TABLET | ORAL | Status: DC | PRN

## 2014-12-26 MED ORDER — ASPIRIN 81 MG PO CHEW
CHEWABLE_TABLET | ORAL | Status: AC
Start: 2014-12-26 — End: 2014-12-26
  Administered 2014-12-26: 324 mg via ORAL
  Filled 2014-12-26: qty 1

## 2014-12-26 MED ORDER — ASPIRIN EC 81 MG PO TBEC
81.0000 mg | DELAYED_RELEASE_TABLET | Freq: Every day | ORAL | Status: DC
  Administered 2014-12-27: 81 mg via ORAL
  Filled 2014-12-26 (×2): qty 1

## 2014-12-26 MED ORDER — SODIUM CHLORIDE 0.9 % IV SOLN: 250.0000 mL | INTRAVENOUS | Status: DC | PRN

## 2014-12-26 MED ORDER — ASPIRIN 81 MG PO CHEW
324.0000 mg | CHEWABLE_TABLET | Freq: Once | ORAL | Status: AC
  Administered 2014-12-26 (×2): 324 mg via ORAL
  Filled 2014-12-26: qty 4

## 2014-12-26 MED ORDER — DULOXETINE HCL 30 MG PO CPEP: 30.0000 mg | ORAL_CAPSULE | Freq: Every day | ORAL | Status: DC

## 2014-12-26 MED ORDER — DULOXETINE HCL 60 MG PO CPEP: 60.0000 mg | ORAL_CAPSULE | Freq: Every day | ORAL | Status: DC

## 2014-12-26 MED ORDER — ATENOLOL 25 MG PO TABS
50.0000 mg | ORAL_TABLET | Freq: Every day | ORAL | Status: DC
  Administered 2014-12-26 – 2014-12-27 (×2): 50 mg via ORAL
  Filled 2014-12-26 (×2): qty 2

## 2014-12-26 MED ORDER — ALLOPURINOL 300 MG PO TABS
300.0000 mg | ORAL_TABLET | Freq: Every day | ORAL | Status: DC
  Administered 2014-12-26 – 2014-12-27 (×2): 300 mg via ORAL
  Filled 2014-12-26 (×2): qty 1

## 2014-12-26 MED ORDER — COLCHICINE 0.6 MG PO TABS
0.6000 mg | ORAL_TABLET | Freq: Two times a day (BID) | ORAL | Status: DC
  Administered 2014-12-26 – 2014-12-27 (×2): 0.6 mg via ORAL
  Filled 2014-12-26 (×2): qty 1

## 2014-12-26 MED ORDER — MORPHINE SULFATE 4 MG/ML IJ SOLN
4.0000 mg | INTRAMUSCULAR | Status: DC | PRN
  Administered 2014-12-26: 4 mg via INTRAVENOUS
  Filled 2014-12-26: qty 1

## 2014-12-26 MED ORDER — SODIUM CHLORIDE 0.9 % IJ SOLN: 3.0000 mL | INTRAMUSCULAR | Status: DC | PRN

## 2014-12-26 MED ORDER — AMLODIPINE BESYLATE 10 MG PO TABS
10.0000 mg | ORAL_TABLET | Freq: Every evening | ORAL | Status: DC
  Administered 2014-12-26 – 2014-12-27 (×2): 10 mg via ORAL
  Filled 2014-12-26 (×2): qty 1

## 2014-12-26 MED ORDER — SIMVASTATIN 10 MG PO TABS
10.0000 mg | ORAL_TABLET | Freq: Every day | ORAL | Status: DC
  Administered 2014-12-26 – 2014-12-27 (×2): 10 mg via ORAL
  Filled 2014-12-26 (×2): qty 1

## 2014-12-26 NOTE — ED Provider Notes (Signed)
CSN: [id number]     Arrival date & time 12/26/14  [id number] History   First MD Initiated Contact with Patient 12/26/14 [phone]     Chief Complaint  Patient presents with  . Chest Pain    HPI   59 YOM with history of HTN, hyperlipidemia, ETOH abuse presents today with left sided chest pain that started at 4:30 AM. Pt describes the pain as dull in the left chest with radiation to his back. He denies N/V, diaphoresis, or pain or SOB with exertion. Patient denies positional related pain or relief. Pt has a history of chest pain but reports this "feels different". He notes a significant family history of MI with his brother dying at the age of 12 from MI. Notes he has hyperlipidemia but does not take his medication. Missed his last PCP appointment, last visit approx. 6 months ago. 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 Pt denies H/A, neck pain, SOB, abdominal pain, leg swelling, recent prolonged immobilization, malignancy, trauma/surgery, h/o DVT PE.    Past Medical History  Diagnosis Date  . Hyperlipidemia   . Hypertension   . Gout   . Alcohol abuse   . Anxiety   . Hyperglycemia   . Family history of premature coronary artery disease    Past Surgical History  Procedure Laterality Date  . Arm fracture     Family History  Problem Relation Age of Onset  . Heart disease Mother   . Hyperlipidemia Mother   . Hypertension Father   . Stroke Father   . Hypertension Sister   . Heart disease Brother     MI at 42yo   History  Substance Use Topics  . Smoking status: Never Smoker   . Smokeless tobacco: Current User    Types: Snuff  . Alcohol Use: Yes     Comment: 5 beers daily    Review of Systems  All other systems reviewed and are negative.   Allergies  Celexa and Zoloft  Home Medications   Prior to Admission medications   Medication Sig Start Date End Date Taking? Authorizing Provider  allopurinol  (ZYLOPRIM) 300 MG tablet TAKE 1 TABLET BY MOUTH EVERY DAY Patient taking differently: TAKE 1 TABLET BY MOUTH DAILY AS NEEDED FOR GOUT FLARE UP. 05/26/14  Yes Mary B Dixon, PA-C  amLODipine (NORVASC) 10 MG tablet TAKE 1 TABLET BY MOUTH EVERY DAY Patient taking differently: TAKE 1 TABLET BY MOUTH EVERY EVENING. 07/23/14  Yes Mary B Dixon, PA-C  atenolol (TENORMIN) 50 MG tablet TAKE 1 TABLET (50 MG TOTAL) BY MOUTH DAILY. Patient taking differently: TAKE 1 TABLET (50 MG TOTAL) BY MOUTH EVERY EVENING. 10/04/14  Yes Mary B Dixon, PA-C  benazepril (LOTENSIN) 40 MG tablet TAKE 1 TABLET (40 MG TOTAL) BY MOUTH DAILY. Patient taking differently: TAKE 1 TABLET (40 MG TOTAL) BY MOUTH EVERY EVENING. 10/04/14  Yes Orlena Sheldon, PA-C  DULoxetine (CYMBALTA) 30 MG capsule TAKE 1 CAPSULE (30 MG TOTAL) BY MOUTH DAILY. 09/10/14  Yes Mary B Dixon, PA-C  DULoxetine (CYMBALTA) 60 MG capsule TAKE 1 CAPSULE (60 MG TOTAL) BY MOUTH DAILY. 09/10/14  Yes Mary B Dixon, PA-C  naproxen sodium (ALEVE) 220 MG tablet Take 220 mg by mouth 2 (two) times daily with a meal.   Yes Historical Provider, MD  sildenafil (VIAGRA) 100 MG tablet Take 1/2 to 1 tablet by mouth as needed for erectile dysfunction 06/10/14  Yes Stanton Kidney  B Dixon, PA-C  simvastatin (ZOCOR) 10 MG tablet TAKE 1 TABLET (10 MG TOTAL) BY MOUTH AT BEDTIME. 06/10/14  Yes Orlena Sheldon, PA-C  clonazePAM (KLONOPIN) 0.5 MG tablet TAKE 1 TABLET TWICE A DAY AS NEEDED Patient not taking: Reported on 12/26/2014 10/16/13   Orlena Sheldon, PA-C  indomethacin (INDOCIN) 50 MG capsule TAKE ONE CAPSULE 3 TIMES A DAY AS NEEDED WITH MEAL Patient not taking: Reported on 06/10/2014 06/26/13   Orlena Sheldon, PA-C   BP 117/80 mmHg  Pulse 64  Temp(Src) 98.2 F (36.8 C) (Oral)  Resp 19  Ht 5\' 10"  (1.778 m)  Wt 159 lb (72.122 kg)  BMI 22.81 kg/m2  SpO2 100%   Physical Exam  Constitutional: He is oriented to person, place, and time. He appears well-developed and well-nourished.  HENT:  Head: Normocephalic and  atraumatic.  Eyes: Conjunctivae are normal. Pupils are equal, round, and reactive to light. Right eye exhibits no discharge. Left eye exhibits no discharge. No scleral icterus.  Neck: Normal range of motion. Neck supple. No JVD present. No tracheal deviation present.  Cardiovascular: Normal rate, regular rhythm, normal heart sounds and intact distal pulses.  Exam reveals no gallop and no friction rub.   No murmur heard. Pulmonary/Chest: Effort normal and breath sounds normal. No stridor. No respiratory distress. He has no wheezes. He has no rales. He exhibits no tenderness.  No lower extremity swelling or edema. Chest non tender to palpation, no pain with change in position.   Abdominal: Soft. He exhibits no distension and no mass. There is no tenderness. There is no rebound and no guarding.  Musculoskeletal: He exhibits no edema or tenderness.  Neurological: He is alert and oriented to person, place, and time. Coordination normal.  Skin: Skin is warm and dry.  Psychiatric: He has a normal mood and affect. His behavior is normal. Judgment and thought content normal.  Nursing note and vitals reviewed.   ED Course  Procedures (including critical care time) Labs Review Labs Reviewed  CBC WITH DIFFERENTIAL/PLATELET - Abnormal; Notable for the following:    MCH 35.1 (*)    All other components within normal limits  COMPREHENSIVE METABOLIC PANEL - Abnormal; Notable for the following:    Calcium 8.6 (*)    AST 51 (*)    All other components within normal limits  TROPONIN I  CBC  CREATININE, SERUM  TSH  TROPONIN I  TROPONIN I  TROPONIN I  D-DIMER, QUANTITATIVE (NOT AT [facility])  Randolm Idol, ED  Randolm Idol, ED    Imaging Review Dg Chest 2 View  12/26/2014   CLINICAL DATA:  Chest pain between the shoulder blades today. Pain on deep inspiration. Initial encounter.  EXAM: CHEST  2 VIEW  COMPARISON:  None.  FINDINGS: The lungs are clear. Heart size is normal. There is no  pneumothorax or pleural effusion. Mild appearing thoracic spondylosis is noted.  IMPRESSION: No acute disease.   Electronically Signed   By: Inge Rise M.D.   On: 12/26/2014 09:15     EKG Interpretation   Date/Time:  Sunday December 26 2014 08:22:07 EDT Ventricular Rate:  77 PR Interval:  176 QRS Duration: 90 QT Interval:  406 QTC Calculation: 459 R Axis:   80 Text Interpretation:  Sinus rhythm LVH by voltage ST elevation, diffuse  Baseline wander No old tracing to compare Confirmed by [facility]  MD,  Nunzio Cory [phone]) on 12/26/2014 8:28:43 AM      EKG Interpretation  Date/Time:  Sunday  December 26 2014 08:36:14 EDT Ventricular Rate:  73 PR Interval:  179 QRS Duration: 92 QT Interval:  411 QTC Calculation: 453 R Axis:   73 Text Interpretation:  Sinus rhythm ST elevation, diffuse Since last tracing of earlier today No significant change was found Confirmed by [facility]  MD, Nunzio Cory [phone]) on 12/26/2014 8:39:52 AM                MDM   Final diagnoses:  Chest pain, unspecified chest pain type   Labs: troponin, CBC, CMP- No significant findings   Imaging: DG chest- no sig findings  Consults: Hospitalist (Dr. Caryn Section) Cardiology   Therapeutics: ASA, Morphine, nitrostat  Discharge Meds:   Assessment/Plan: 41 YOM with significant ACS risk factors presents today with chest pain. Initial troponin negative, pain relieved with nitro/morphine/asa. Uncertain etiology of chest pain, pt has significant risk factors that would indicate need for ACS rule out.  Hospitalist service consulted who felt patient should have cardiology consult today with [facility] admission.  Cardiology ( Dr. Cathie Olden) consulted who agreed for [facility] admission for further evaluation and management. Pt remained stable here in the ED and was transferred to [facility]. Pt understood and agreed to today's plan.         Okey Regal, PA-C 12/26/14 [place], DO 12/28/14 [phone]

## 2014-12-26 NOTE — ED Notes (Signed)
Report given to [facility] 

## 2014-12-26 NOTE — ED Notes (Signed)
PA at bedside.

## 2014-12-26 NOTE — ED Notes (Signed)
Attempted report 

## 2014-12-26 NOTE — H&P (Signed)
History and Physical   Patient ID: Jonathan Hodges, MRN: [id number], DOB: [birth year]   Date of Encounter: 12/26/2014, 2:50 PM  Primary Care Provider: Karis Juba, PA-C Cardiologist: New - Dr. Liam Rogers    Chief Complaint:   Chest Pain   History of Present Illness: Jonathan Hodges is a 59 y.o. male with a hx of anxiety, ETOH abuse, HTN, HL/Hypertriglyceridemia, ED, gout.    The patient notes occasional episodes of chest tightness over the past 2 months. These were not related to exertion. 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 Today, upon awakening, he noted severe chest tightness. He has had radiation to his left shoulder and posterior neck. He denies associated dyspnea, diaphoresis, nausea. He denies syncope, orthopnea, PND or edema. He has had some improvement in symptoms with nitroglycerin and morphine. ECG at [facility] demonstrated diffuse ST elevation. Repeat ECG here demonstrates less ST elevation. Cardiac enzymes are negative so far. He does note pleuritic chest discomfort. Leaning forward improved his symptoms during his exam.   Past Medical History  Diagnosis Date  . Hyperlipidemia   . Hypertension   . Gout   . Alcohol abuse   . Anxiety   . Hyperglycemia   . Family history of premature coronary artery disease      Past Surgical History  Procedure Laterality Date  . Arm fracture        Prior to Admission medications   Medication Sig Start Date End Date Taking? Authorizing Provider  allopurinol (ZYLOPRIM) 300 MG tablet TAKE 1 TABLET BY MOUTH EVERY DAY Patient taking differently: TAKE 1 TABLET BY MOUTH DAILY AS NEEDED FOR GOUT FLARE UP. 05/26/14  Yes Mary B Dixon, PA-C  amLODipine (NORVASC) 10 MG tablet TAKE 1 TABLET BY MOUTH EVERY DAY Patient taking differently: TAKE 1 TABLET BY MOUTH EVERY EVENING. 07/23/14  Yes Mary B Dixon, PA-C  atenolol (TENORMIN) 50 MG tablet TAKE 1 TABLET (50 MG TOTAL) BY MOUTH  DAILY. Patient taking differently: TAKE 1 TABLET (50 MG TOTAL) BY MOUTH EVERY EVENING. 10/04/14  Yes Mary B Dixon, PA-C  benazepril (LOTENSIN) 40 MG tablet TAKE 1 TABLET (40 MG TOTAL) BY MOUTH DAILY. Patient taking differently: TAKE 1 TABLET (40 MG TOTAL) BY MOUTH EVERY EVENING. 10/04/14  Yes Orlena Sheldon, PA-C  DULoxetine (CYMBALTA) 30 MG capsule TAKE 1 CAPSULE (30 MG TOTAL) BY MOUTH DAILY. 09/10/14  Yes Mary B Dixon, PA-C  DULoxetine (CYMBALTA) 60 MG capsule TAKE 1 CAPSULE (60 MG TOTAL) BY MOUTH DAILY. 09/10/14  Yes Mary B Dixon, PA-C  naproxen sodium (ALEVE) 220 MG tablet Take 220 mg by mouth 2 (two) times daily with a meal.   Yes Historical Provider, MD  sildenafil (VIAGRA) 100 MG tablet Take 1/2 to 1 tablet by mouth as needed for erectile dysfunction 06/10/14  Yes Orlena Sheldon, PA-C  simvastatin (ZOCOR) 10 MG tablet TAKE 1 TABLET (10 MG TOTAL) BY MOUTH AT BEDTIME. 06/10/14  Yes Orlena Sheldon, PA-C  clonazePAM (KLONOPIN) 0.5 MG tablet TAKE 1 TABLET TWICE A DAY AS NEEDED Patient not taking: Reported on 12/26/2014 10/16/13   Orlena Sheldon, PA-C  indomethacin (INDOCIN) 50 MG capsule TAKE ONE CAPSULE 3 TIMES A DAY AS NEEDED WITH MEAL Patient not taking: Reported on 06/10/2014 06/26/13   Orlena Sheldon, PA-C      Allergies: Allergies  Allergen Reactions  . Celexa [Citalopram Hydrobromide]     "Moody", "Change in personality", "Agitated"  . Zoloft [  Sertraline Hcl]     "Just didn't feel right" "confused, couldn't think clearly"     Social History:  The patient  reports that he has never smoked. His smokeless tobacco use includes Snuff. He reports that he drinks alcohol. He reports that he does not use illicit drugs.   Family History:  The patient's family history includes Heart disease in his brother and mother; Hyperlipidemia in his mother; Hypertension in his father and sister; Stroke in his father.   ROS:  Please see the history of present illness.  Denies fevers, chills, cough, wheezing, melena,  hematochezia. He has noted a 30 pound weight loss over the last 3-6 months. He denies night sweats or fevers.  All other systems reviewed and negative.   Vital Signs: Blood pressure 117/80, pulse 64, temperature 98.2 F (36.8 C), temperature source Oral, resp. rate 19, height 5\' 10"  (1.778 m), weight 159 lb (72.122 kg), SpO2 100 %.  PHYSICAL EXAM: General:  Well nourished, well developed, in no acute distress  HEENT: normal Lymph: no adenopathy Neck: no JVD Endocrine:  No thryomegaly Vascular: No carotid bruits Cardiac:  normal S1, S2; RRR; no murmur Lungs:  clear to auscultation bilaterally, no wheezing, rhonchi or rales Abd: soft, nontender, no hepatomegaly Ext: no edema Musculoskeletal:  No deformities, BUE and BLE strength normal and equal Skin: warm and dry Neuro:  CNs 2-12 intact, no focal abnormalities noted Psych:  Normal affect    EKG:   #1 - NSR, HR 77, diffuse ST elevation versus J-point elevation #2 - NSR, HR 69, nonspecific ST-T wave changes, J-point elevation V6  Labs:   Lab Results  Component Value Date   WBC 5.6 12/26/2014   HGB 16.2 12/26/2014   HCT 46.1 12/26/2014   MCV 100.0 12/26/2014   PLT 168 12/26/2014      Recent Labs Lab 12/26/14 0845  NA 141  K 3.9  CL 107  CO2 23  BUN 9  CREATININE 0.81  CALCIUM 8.6*  PROT 7.3  BILITOT 0.9  ALKPHOS 49  ALT 29  AST 51*  GLUCOSE 92     Recent Labs  12/26/14 0845  TROPONINI <0.03    No results found for: DDIMER   Radiology/Studies:   Dg Chest 2 View  12/26/2014   CLINICAL DATA:  Chest pain between the shoulder blades today. Pain on deep inspiration. Initial encounter.  EXAM: CHEST  2 VIEW  COMPARISON:  None.  FINDINGS: The lungs are clear. Heart size is normal. There is no pneumothorax or pleural effusion. Mild appearing thoracic spondylosis is noted.  IMPRESSION: No acute disease.   Electronically Signed   By: Inge Rise M.D.   On: 12/26/2014 09:15      ASSESSMENT AND PLAN:    1.  Chest Pain:   The patient's symptom complex appears to be most consistent with pericarditis. He has pleuritic symptoms that improved with leaning forward. He denies exertional symptoms. He does have some risk factors for coronary artery disease including family history, hypertension and hyperlipidemia.  Will place on observation.  Keep on Tele.  -  Start ibuprofen 400 mg 3 times a day  -  Start Colchicine 0.6 mg twice a day   -  Continue to cycle cardiac enzymes   -  DVT Lovenox.  Change to full dose if CEs positive.  -  Obtain 2-D echocardiogram   -  If myocardial infarction ruled out, proceed with Lexiscan Myoview in the a.m.   -  Continue beta  blocker, statin   -  Add ASA 81 mg daily   -  Check DDimer.   -  Repeat ECG in AM. 2.  Alcohol Abuse: CIWA protocol  3.  Hypertension: Controlled.  4.  Dyslipidemia: Continue statin.  5.  Anxiety: Continue current therapy.    Danton Sewer, PA-C 12/26/2014 2:50 PM  Pager # [phone]    Attending Note:   The patient was seen and examined.  Agree with assessment and plan as noted above.  Changes made to the above note as needed.  1. Acute pericarditis.:  Symptoms are very consistent with acute pericarditis. Definite pleuritic  component.   Feels better with leaning forward.   Initial troponin is negative.  Will continue to get troponin levels.   No heparin needed as symptoms ar c/w pericarditis. Will get a stress myview in the am - strong family hx of CAD   Start colchicine 0.6 bid and motrin 400 tid   Observation   Ramond Dial., MD, [facility] 12/26/2014, 2:53 PM [address]  [place] Pager [phone]

## 2014-12-26 NOTE — ED Notes (Signed)
PT c/o left sided chest pain radiating into his upper back this morning at 0500 describes as tightness.

## 2014-12-27 ENCOUNTER — Observation Stay (HOSPITAL_COMMUNITY): Payer: BLUE CROSS/BLUE SHIELD

## 2014-12-27 DIAGNOSIS — E785 Hyperlipidemia, unspecified: Secondary | ICD-10-CM | POA: Diagnosis not present

## 2014-12-27 DIAGNOSIS — R0789 Other chest pain: Secondary | ICD-10-CM | POA: Diagnosis not present

## 2014-12-27 DIAGNOSIS — F1010 Alcohol abuse, uncomplicated: Secondary | ICD-10-CM

## 2014-12-27 DIAGNOSIS — R079 Chest pain, unspecified: Secondary | ICD-10-CM

## 2014-12-27 DIAGNOSIS — I10 Essential (primary) hypertension: Secondary | ICD-10-CM | POA: Diagnosis not present

## 2014-12-27 LAB — LIPID PANEL
Cholesterol: 182 mg/dL (ref 0–200)
HDL: 52 mg/dL (ref >40)
LDL Cholesterol: 99 mg/dL (ref 0–99)
TRIGLYCERIDES: 155 mg/dL — AB (ref <150)
Total CHOL/HDL Ratio: 3.5 RATIO
VLDL: 31 mg/dL (ref 0–40)

## 2014-12-27 LAB — TROPONIN I: Troponin I: <0.03 ng/mL (ref <0.031)

## 2014-12-27 MED ORDER — ALLOPURINOL 300 MG PO TABS: ORAL_TABLET | ORAL | Status: DC

## 2014-12-27 MED ORDER — BENAZEPRIL HCL 40 MG PO TABS: ORAL_TABLET | ORAL | Status: DC

## 2014-12-27 MED ORDER — ATENOLOL 50 MG PO TABS: ORAL_TABLET | ORAL | Status: DC

## 2014-12-27 MED ORDER — ACETAMINOPHEN 325 MG PO TABS: 650.0000 mg | ORAL_TABLET | ORAL | Status: DC | PRN

## 2014-12-27 MED ORDER — TECHNETIUM TC 99M SESTAMIBI - CARDIOLITE
30.0000 | Freq: Once | INTRAVENOUS | Status: AC | PRN
  Administered 2014-12-27: 08:00:00 30 via INTRAVENOUS

## 2014-12-27 MED ORDER — TECHNETIUM TC 99M SESTAMIBI GENERIC - CARDIOLITE
10.0000 | Freq: Once | INTRAVENOUS | Status: AC | PRN
  Administered 2014-12-27: 10 via INTRAVENOUS

## 2014-12-27 MED ORDER — AMLODIPINE BESYLATE 10 MG PO TABS: 10.0000 mg | ORAL_TABLET | Freq: Every evening | ORAL | Status: DC

## 2014-12-27 MED ORDER — REGADENOSON 0.4 MG/5ML IV SOLN
INTRAVENOUS | Status: AC
  Administered 2014-12-27: 0.4 mg
  Filled 2014-12-27: qty 5

## 2014-12-27 MED ORDER — IBUPROFEN 400 MG PO TABS: 400.0000 mg | ORAL_TABLET | Freq: Three times a day (TID) | ORAL | Status: DC

## 2014-12-27 MED ORDER — COLCHICINE 0.6 MG PO TABS: 0.6000 mg | ORAL_TABLET | Freq: Two times a day (BID) | ORAL | Status: DC

## 2014-12-27 NOTE — Progress Notes (Addendum)
    Subjective:  Chest pain improved  Objective:  Vital Signs in the last 24 hours: Temp:  [98.1 F (36.7 C)-99.2 F (37.3 C)] 98.5 F (36.9 C) (07/25 0500) Pulse Rate:  [55-78] 58 (07/25 0500) Resp:  [12-24] 19 (07/24 2300) BP: (107-141)/(74-102) 114/75 mmHg (07/25 0500) SpO2:  [95 %-100 %] 100 % (07/25 0500) Weight:  [159 lb (72.122 kg)-175 lb (79.379 kg)] 160 lb 8 oz (72.802 kg) (07/25 0500)  Intake/Output from previous day: No intake or output data in the 24 hours ending 12/27/14 0752  Physical Exam: General appearance: alert, cooperative and no distress Neck: no JVD Lungs: clear to auscultation bilaterally Heart: regular rate and rhythm Extremities: extremities normal, atraumatic, no cyanosis or edema Skin: Skin color, texture, turgor normal. No rashes or lesions Neurologic: Grossly normal   Rate: 58  Rhythm: normal sinus rhythm  Lab Results:  Recent Labs  12/26/14 0845 12/26/14 1735  WBC 5.6 5.5  HGB 16.2 15.4  PLT 168 134*    Recent Labs  12/26/14 0845 12/26/14 1735  NA 141  --   K 3.9  --   CL 107  --   CO2 23  --   GLUCOSE 92  --   BUN 9  --   CREATININE 0.81 0.88    Recent Labs  12/26/14 2100 12/27/14 0320  TROPONINI <0.03 <0.03   No results for input(s): INR in the last 72 hours.  Scheduled Meds: . allopurinol  300 mg Oral Daily  . amLODipine  10 mg Oral QPM  . aspirin EC  81 mg Oral Daily  . atenolol  50 mg Oral Daily  . benazepril  40 mg Oral Daily  . colchicine  0.6 mg Oral BID  . DULoxetine  90 mg Oral Daily  . enoxaparin (LOVENOX) injection  40 mg Subcutaneous Q24H  . ibuprofen  400 mg Oral TID  . simvastatin  10 mg Oral q1800  . sodium chloride  3 mL Intravenous Q12H   Continuous Infusions:  PRN Meds:.sodium chloride, acetaminophen, morphine injection, nitroGLYCERIN, ondansetron (ZOFRAN) IV, sodium chloride   Imaging: Imaging results have been reviewed  Cardiac Studies:  Assessment/Plan:  59 y.o. male with a hx of  anxiety, ETOH abuse, HTN, HL/Hypertriglyceridemia,and a FM HX of CAD- admitted with chest pain and an abnormal EKG 12/26/14.   Principal Problem:   Chest pain Active Problems:   Hypertension   Hyperlipidemia   Alcohol abuse   Anxiety   Family history of premature coronary artery disease   PLAN: Myoview and echo today- possible discharge home late today if above negative.    Kerin Ransom PA-C 12/27/2014, 7:52 AM [phone]  I have examined the patient and reviewed assessment and plan and discussed with patient.  Agree with above as stated.  CP worse with inspiration.  Stress test pending.  Dispo based on stress test result.  BP stable.  Will need long term lipid management for risk reduction.  Cardiology f/u with Dr. Acie Fredrickson.  Ameisha Mcclellan S.

## 2014-12-27 NOTE — Progress Notes (Signed)
  Echocardiogram 2D Echocardiogram has been performed.  Jonathan Hodges 12/27/2014, 3:54 PM

## 2014-12-27 NOTE — Progress Notes (Signed)
The patient tolerated the Lexiscan very well.  Brock Mokry, PAC

## 2014-12-27 NOTE — Discharge Summary (Signed)
Physician Discharge Summary     Cardiologist: Nasher Patient ID: Jonathan Hodges MRN: [id number] DOB/AGE: [birth year] 59 y.o.  Admit date: 12/26/2014 Discharge date: 12/27/2014  Admission Diagnoses:  Chest pain  Discharge Diagnoses:  Principal Problem:   Chest pain Active Problems:   Hyperlipidemia   Hypertension   Alcohol abuse   Anxiety   Family history of premature coronary artery disease   Discharged Condition: stable  Hospital Course:  Jonathan Hodges is a 59 y.o. male with a hx of anxiety, ETOH abuse, HTN, HL/Hypertriglyceridemia, ED, gout.   The patient notes occasional episodes of chest tightness over the past 2 months. These were not related to exertion. 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 Today, upon awakening, he noted severe chest tightness. He has had radiation to his left shoulder and posterior neck. He denies associated dyspnea, diaphoresis, nausea. He denies syncope, orthopnea, PND or edema. He has had some improvement in symptoms with nitroglycerin and morphine. ECG at [facility] demonstrated diffuse ST elevation. Repeat ECG here demonstrates less ST elevation. Cardiac enzymes are negative so far. He does note pleuritic chest discomfort. Leaning forward improved his symptoms during his exam.  He was admitted for observation and ruled out for MI.  Lexiscan stress test was normal.  Echocardiogram revealed an EF of 50-55% with normal wall motion, mild AI, G1DD, no pericardial effusion.  Statin therapy continued.  EKG revealed diffuse ST elevation.  He was started on colchicine and ibuprofen which we will continue for two weeks.  The patient was seen by Dr. Irish Lack who felt he was stable for DC home.   Consults: None  Significant Diagnostic Studies:  Echo  Study Conclusions  - Left ventricle: The cavity size was normal. Wall thickness was normal. Systolic function was normal. The estimated  ejection fraction was in the range of 50% to 55%. Wall motion was normal; there were no regional wall motion abnormalities. Doppler parameters are consistent with abnormal left ventricular relaxation (grade 1 diastolic dysfunction). - Aortic valve: There was mild regurgitation. -No pericardial effusion  Treatments: See above  Discharge Exam: Blood pressure 147/75, pulse 56, temperature 97.9 F (36.6 C), temperature source Oral, resp. rate 15, height 5\' 10"  (1.778 m), weight 160 lb 8 oz (72.802 kg), SpO2 96 %.   Disposition: Final discharge disposition not confirmed      Discharge Instructions    Diet - low sodium heart healthy    Complete by:  As directed             Medication List    STOP taking these medications        ALEVE 220 MG tablet  Generic drug:  naproxen sodium     clonazePAM 0.5 MG tablet  Commonly known as:  KLONOPIN     indomethacin 50 MG capsule  Commonly known as:  INDOCIN      TAKE these medications        acetaminophen 325 MG tablet  Commonly known as:  TYLENOL  Take 2 tablets (650 mg total) by mouth every 4 (four) hours as needed for headache or mild pain.     allopurinol 300 MG tablet  Commonly known as:  ZYLOPRIM  TAKE 1 TABLET BY MOUTH DAILY AS NEEDED FOR GOUT FLARE UP.     amLODipine 10 MG tablet  Commonly known as:  NORVASC  Take 1 tablet (10 mg total) by mouth every evening.     atenolol 50 MG  tablet  Commonly known as:  TENORMIN  TAKE 1 TABLET (50 MG TOTAL) BY MOUTH EVERY EVENING.     benazepril 40 MG tablet  Commonly known as:  LOTENSIN  TAKE 1 TABLET (40 MG TOTAL) BY MOUTH EVERY EVENING.     colchicine 0.6 MG tablet  Take 1 tablet (0.6 mg total) by mouth 2 (two) times daily.     DULoxetine 30 MG capsule  Commonly known as:  CYMBALTA  TAKE 1 CAPSULE (30 MG TOTAL) BY MOUTH DAILY.     DULoxetine 60 MG capsule  Commonly known as:  CYMBALTA  TAKE 1 CAPSULE (60 MG TOTAL) BY MOUTH DAILY.     ibuprofen 400 MG tablet   Commonly known as:  ADVIL,MOTRIN  Take 1 tablet (400 mg total) by mouth 3 (three) times daily.     sildenafil 100 MG tablet  Commonly known as:  VIAGRA  Take 1/2 to 1 tablet by mouth as needed for erectile dysfunction     simvastatin 10 MG tablet  Commonly known as:  ZOCOR  TAKE 1 TABLET (10 MG TOTAL) BY MOUTH AT BEDTIME.       Follow-up Information    Follow up with Nahser, Wonda Cheng, MD.   Specialty:  Cardiology   Why:  office will call you   Contact information:   [address] [place] [address] [phone]      Greater than 30 minutes was spent completing the patient's discharge.    SignedTarri Fuller, [place] 12/27/2014, 6:52 PM  I have examined the patient and reviewed assessment and plan and discussed with patient.  Agree with above as stated.  Negative stress test.  Patient being treated for pericarditis.  F/u with Dr. Acie Fredrickson.   VARANASI,JAYADEEP S.

## 2014-12-27 NOTE — Discharge Instructions (Signed)

## 2014-12-29 ENCOUNTER — Encounter: Payer: Self-pay | Admitting: Nurse Practitioner

## 2014-12-29 ENCOUNTER — Telehealth: Payer: Self-pay | Admitting: Cardiovascular Disease

## 2014-12-29 ENCOUNTER — Encounter: Payer: Self-pay | Admitting: Cardiovascular Disease

## 2014-12-29 NOTE — Telephone Encounter (Signed)
Spoke with patient who states he is feeling better and would like to return to work tomorrow.  I advised him that I can print return to work letter.  Patient states he will call back with fax number or he will come to office and pick up the letter.

## 2014-12-29 NOTE — Telephone Encounter (Signed)
New Message     Pt calling wanting to get a note stating he is released to go back to work. Please call back and advise.

## 2014-12-29 NOTE — Telephone Encounter (Signed)
Letter placed at front desk for patient to pick up as I did not receive a call back and I will not be in the office tomorrow.

## 2014-12-29 NOTE — Telephone Encounter (Signed)
Left message for patient to call me

## 2014-12-29 NOTE — Telephone Encounter (Signed)
Routing to Dr. Acie Fredrickson for advice regarding when patient can return to work

## 2014-12-29 NOTE — Telephone Encounter (Signed)
I'm assuming that he is feeling better. He may return to work on Monday . He may return sooner if he feels better and wants to get back  Thanks

## 2014-12-30 ENCOUNTER — Telehealth: Payer: Self-pay | Admitting: Cardiovascular Disease

## 2014-12-30 NOTE — Telephone Encounter (Signed)
Spoke with pt regarding faxing a RTW letter. Pt would like for the return to work letter to be faxed to  Fax # [phone] attention to [facility]. Letter faxed this AM. The original letter was mail to pt. Pt is aware.

## 2014-12-30 NOTE — Telephone Encounter (Signed)
Follow Up       Pt calling stating that he needs to give Jonathan Hodges a fax number for her to fax his letter to. I notified pt that Michelle's previous note states that she left his letter at the front desk. Pt states that he doesn't live in [place] and won't be able to pick it up and would like it faxed to Fax #: [phone]. Please call pt back and advise.

## 2015-01-17 ENCOUNTER — Other Ambulatory Visit: Payer: Self-pay | Admitting: Physician Assistant

## 2015-01-17 NOTE — Telephone Encounter (Signed)
Medication filled x1 with no refills.   Requires office visit before any further refills can be given.   Letter sent.  

## 2015-02-01 ENCOUNTER — Ambulatory Visit: Payer: BLUE CROSS/BLUE SHIELD | Admitting: Physician Assistant

## 2015-02-16 ENCOUNTER — Other Ambulatory Visit: Payer: Self-pay | Admitting: Physician Assistant

## 2015-02-16 NOTE — Telephone Encounter (Signed)
Prescription sent to pharmacy.

## 2015-03-04 ENCOUNTER — Other Ambulatory Visit: Payer: Self-pay | Admitting: Physician Assistant

## 2015-05-08 ENCOUNTER — Other Ambulatory Visit: Payer: Self-pay | Admitting: Physician Assistant

## 2015-05-09 ENCOUNTER — Encounter: Payer: Self-pay | Admitting: Family Medicine

## 2015-05-09 NOTE — Telephone Encounter (Signed)
Medication refill for one time only.  Patient needs to be seen.  Letter sent for patient to call and schedule 

## 2015-05-14 ENCOUNTER — Other Ambulatory Visit: Payer: Self-pay | Admitting: Physician Assistant

## 2015-05-16 NOTE — Telephone Encounter (Signed)
Medication refilled per protocol. 

## 2015-07-17 ENCOUNTER — Other Ambulatory Visit: Payer: Self-pay | Admitting: Physician Assistant

## 2015-07-18 ENCOUNTER — Encounter: Payer: Self-pay | Admitting: Family Medicine

## 2015-07-18 NOTE — Telephone Encounter (Signed)
Medication refill for one time only.  Patient needs to be seen.  Letter sent for patient to call and schedule.  Has been more then 1 year since last OV

## 2015-08-17 ENCOUNTER — Other Ambulatory Visit: Payer: Self-pay | Admitting: Physician Assistant

## 2015-08-18 NOTE — Telephone Encounter (Signed)
Refill denied.  Pt has not been seen in over a year.  Has been sent 2 letters to make appt.

## 2015-08-22 ENCOUNTER — Ambulatory Visit (INDEPENDENT_AMBULATORY_CARE_PROVIDER_SITE_OTHER): Payer: BLUE CROSS/BLUE SHIELD | Admitting: Physician Assistant

## 2015-08-22 VITALS — BP 122/74 | HR 72 | Temp 98.5°F | Resp 18 | Wt 177.0 lb

## 2015-08-22 DIAGNOSIS — R739 Hyperglycemia, unspecified: Secondary | ICD-10-CM

## 2015-08-22 DIAGNOSIS — F1010 Alcohol abuse, uncomplicated: Secondary | ICD-10-CM

## 2015-08-22 DIAGNOSIS — E785 Hyperlipidemia, unspecified: Secondary | ICD-10-CM

## 2015-08-22 DIAGNOSIS — F419 Anxiety disorder, unspecified: Secondary | ICD-10-CM

## 2015-08-22 DIAGNOSIS — I10 Essential (primary) hypertension: Secondary | ICD-10-CM

## 2015-08-22 DIAGNOSIS — M1000 Idiopathic gout, unspecified site: Secondary | ICD-10-CM

## 2015-08-22 DIAGNOSIS — F411 Generalized anxiety disorder: Secondary | ICD-10-CM

## 2015-08-22 DIAGNOSIS — Z8249 Family history of ischemic heart disease and other diseases of the circulatory system: Secondary | ICD-10-CM

## 2015-08-22 MED ORDER — ATENOLOL 50 MG PO TABS: 50.0000 mg | ORAL_TABLET | Freq: Every day | ORAL | Status: DC

## 2015-08-22 MED ORDER — AMLODIPINE BESYLATE 10 MG PO TABS: 10.0000 mg | ORAL_TABLET | Freq: Every evening | ORAL | Status: DC

## 2015-08-22 MED ORDER — ALLOPURINOL 300 MG PO TABS: ORAL_TABLET | ORAL | Status: DC

## 2015-08-22 MED ORDER — BENAZEPRIL HCL 40 MG PO TABS
40.0000 mg | ORAL_TABLET | Freq: Every day | ORAL | Status: DC
Start: 2015-08-22 — End: 2016-03-16

## 2015-08-22 NOTE — Progress Notes (Signed)
Patient ID: Jonathan Hodges MRN: [id number], DOB: June 28, 1955, 60 y.o. Date of Encounter: @DATE @  Chief Complaint:  Chief Complaint  Patient presents with  . check up for med refills    is fasting    HPI: 60 y.o. year old male  presents routine followup.  Anxiety: In past I have treated him with both Zoloft and Celexa -- he had adverse effects with both of these. He is currently being treated with Cymbalta which is finally working and not causing adverse effects. At his last visit with me in 05/25/13 he was on Cymbalta 60 mg.At  That point he reported that his anxiety and nervousness had decreased somewhat with this medication. At that time I increased the dose to 90 mg. He is taking a 60 mg tablet in addition to a 30 mg. At f/u-- he says that he is feeling much better. He can notice a huge improvement and feels much calmer. 08/22/2015--- States that he currently is without insurance. 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 Says that he stopped the Cymbalta. Says that he really isn't noticing much difference off of the medicine and has been stable off of medicine.  Hypertension: In past he reported that Bystolic was costing [id number] per month. Therefore we stopped the Bystolic and change to atenolol 50 mg. Today he reports he is taking the atenolol daily as directed. 08/22/2015--- even though he is currently with no insurance, he states that he is taking all blood pressure medications as directed. No adverse effects.  Hyper lipidemia: AT 06/2014 OV --he says that he has been out of his cholesterol medication for one month. Says that it was hard for him to get in for an office visit and refills ran out. At [place] 08/22/2015--- he reports that he has been off of the cholesterol medication. Says that he has an off of that a long time-- that he had already stopped it prior to the loss of insurance.  Hyperglycemia: Has made  very little changes in his diet.  Alcohol use: Last visit he said he really had not cut back on this since his prior visit that we have discussed trying to cut back. At that time he was treating 4 beers every night. Was drinking an 18 pack beer over the weekend. 09/2013-- he reports that his alcohol intake has decreased recently but it has only been because of his work schedule--he  has been having to work more. 06/2014--- says that a week night he usually drinks about 2-3 beers. Over a weekend-- usually about a 12 pack. Says  alcohol intake definitely has continued to decrease. 08/2015---- says that he is drinking about 4 beers every night and about 18 pack over a weekend.  Erectile dysfunction--He c/o of this at [place] 09/2013. The following is copied from htat note:Marland Kitchen He says that this has started since his last visit and is wondering if it is secondary to some of the medicines. He is not married but says he does have a girlfriend.Lives alone.  Says he has had no gout flare.   Past Medical History  Diagnosis Date  . Hyperlipidemia   . Hypertension   . Gout   . Alcohol abuse   . Anxiety   . Hyperglycemia   . Family history of premature coronary artery disease      Home Meds:  Outpatient Prescriptions Prior to Visit  Medication Sig Dispense Refill  .  acetaminophen (TYLENOL) 325 MG tablet Take 2 tablets (650 mg total) by mouth every 4 (four) hours as needed for headache or mild pain.    Marland Kitchen allopurinol (ZYLOPRIM) 300 MG tablet TAKE 1 TABLET BY MOUTH DAILY AS NEEDED FOR GOUT FLARE UP. 30 tablet 0  . amLODipine (NORVASC) 10 MG tablet Take 1 tablet (10 mg total) by mouth every evening. 30 tablet 5  . atenolol (TENORMIN) 50 MG tablet TAKE 1 TABLET BY MOUTH EVERY DAY 30 tablet 0  . benazepril (LOTENSIN) 40 MG tablet TAKE 1 TABLET BY MOUTH EVERY DAY 30 tablet 0  . ibuprofen (ADVIL,MOTRIN) 400 MG tablet Take 1 tablet (400 mg total) by mouth 3 (three) times daily. 30 tablet 0  . sildenafil (VIAGRA) 100  MG tablet Take 1/2 to 1 tablet by mouth as needed for erectile dysfunction 5 tablet 10  . colchicine 0.6 MG tablet Take 1 tablet (0.6 mg total) by mouth 2 (two) times daily. (Patient not taking: Reported on 08/22/2015) 30 tablet 0  . DULoxetine (CYMBALTA) 30 MG capsule TAKE 1 CAPSULE (30 MG TOTAL) BY MOUTH DAILY. (Patient not taking: Reported on 08/22/2015) 30 capsule 3  . DULoxetine (CYMBALTA) 60 MG capsule TAKE 1 CAPSULE (60 MG TOTAL) BY MOUTH DAILY. (Patient not taking: Reported on 08/22/2015) 30 capsule 3  . simvastatin (ZOCOR) 10 MG tablet TAKE 1 TABLET (10 MG TOTAL) BY MOUTH AT BEDTIME. (Patient not taking: Reported on 08/22/2015) 30 tablet 1   No facility-administered medications prior to visit.     Allergies:  Allergies  Allergen Reactions  . Celexa [Citalopram Hydrobromide]     "Moody", "Change in personality", "Agitated"  . Zoloft [Sertraline Hcl]     "Just didn't feel right" "confused, couldn't think clearly"    Social History   Social History  . Marital Status: Single    Spouse Name: N/A  . Number of Children: N/A  . Years of Education: N/A   Occupational History  . Not on file.   Social History Main Topics  . Smoking status: Never Smoker   . Smokeless tobacco: Current User    Types: Snuff  . Alcohol Use: Yes     Comment: 5 beers daily  . Drug Use: No  . Sexual Activity: Not on file   Other Topics Concern  . Not on file   Social History Narrative   Divorced. Lives alone.   Works as Clinical biochemist.    Family History  Problem Relation Age of Onset  . Heart disease Mother   . Hyperlipidemia Mother   . Hypertension Father   . Stroke Father   . Hypertension Sister   . Heart disease Brother     MI at 11yo     Review of Systems:  See HPI for pertinent ROS. All other ROS negative.    Physical Exam: Blood pressure 122/74, pulse 72, temperature 98.5 F (36.9 C), temperature source Oral, resp. rate 18, weight 177 lb (80.287 kg)., Body mass index is 25.4  kg/(m^2). General: WNWD WM. Appears in no acute distress. Neck: Supple. No thyromegaly. No lymphadenopathy. No Carotid bruit Lungs: Clear bilaterally to auscultation without wheezes, rales, or rhonchi. Breathing is unlabored. Heart: RRR with S1 S2. No murmurs, rubs, or gallops. Abdomen: Soft, non-tender, non-distended with normoactive bowel sounds. No hepatomegaly. No rebound/guarding. No obvious abdominal masses. Musculoskeletal:  Strength and tone normal for age. Extremities/Skin: Warm and dry. No clubbing or cyanosis. No edema. No rashes or suspicious lesions. Neuro: Alert and oriented X 3. Moves all  extremities spontaneously. Gait is normal. CNII-XII grossly in tact. Psych:  Responds to questions appropriately with a normal affect. Does appear slightly nervous, anxious, shaky today.      ASSESSMENT AND PLAN:  60 y.o. year old male with    Hypertension Blood pressure well controlled. Continue current medicines. Check lab to monitor. - COMPLETE METABOLIC PANEL WITH GFR  Family history of premature coronary artery disease Brother died of MI at age 44. Discussed with patient obtaining a Cardiolite-- he has deferred. He is still having no angina symptoms even with exertion. I've informed him of angina symptoms and seek medical evaluation immediately if he does develop such symptoms.  Hyperlipidemia At OV 08/22/2015---He reports he has been off cholesterol med a very long time.  Reviewed that he had hospitalization 12/2014--and lipid panel was drawn then. He states that he was not taking the cholesterol medication at the time of that hospitalization 12/2014. Reviewed that lipid panel looked good at that time with triglyceride 155, HDL 52, LDL 99. He currently has no insurance so will not check FLP and make him pay for this. Lipid panel was ok off of cholesterol medication 12/2014.   Hyperglycemia - COMPLETE METABOLIC PANEL WITH GFR - Hemoglobin A1c   Gout Currently controlled. No recent  flares. Uric Acid level 4.9 at lab 03/05/13. 08/22/2015--- currently with no insurance coverage and reports no gout flares so we'll hold off on rechecking uric acid level for cost purposes.   Erectile dysfunction The following is copied from OV Note 09/2013. : Discussed that the erectile dysfunction could be secondary to the Cymbalta or the atenolol. I really do not want to stop the Cymbalta. In The past he had adverse effects of Zoloft and Celexa. Finally the Cymbalta is working and causing no adverse effects. Asol, I really do not want to stop the atenolol. Do not have many options for his blood pressure . Could not use Bystolic sec to cost.  HCTZ can also cause ED He is on Benazepril 40 mg which is max dose. He is on Norvasc 10 mg which is max dose. - sildenafil (VIAGRA) 100 MG tablet; Take 0.5-1 tablets (50-100 mg total) by mouth daily as needed for erectile dysfunction.  Dispense: 3 tablet; Refill: 11  GAD (generalized anxiety disorder) He is now off Cymbalta.   Alcohol abuse In Past:  Discussed that  while he has decreased his amount of alcohol secondary to his work schedule, to please try to keep the amount decreased. Also discussed with him going to alcoholics anonymous going to some type of rehabilitation program. He is not interested at all. Says  he "has been drinking all of his life.--and that's just the way I am'"  06/2014: Pt says he has decreased alcohol intake a lot.   Office visit 03/05/13 he reported that his toes on both feet felt numb and tingly at night and early in the morning. I did extensive lab panel was all normal. I was unable to line 80 thiamine because it requires specialty with special conditions. Obtain this now. Patient states that the tingling in his feet has resolved.  08/22/2015--- he reports alcohol use is back up to where it was in the past. He is well aware of all of the medical problems that this can cause and the need to decrease/stop alcohol  use.  colorectal cancer screening: He has never had screening colonoscopy. I discussed risks benefits and me he repeatedly defers. Also discussed Hemoccult testing but he is deferred.  Prostate cancer screening: Screening PSA was done in 03/05/13 normal. 08/22/2015--- currently without insurance so we'll hold off on checking PSA   Immunizations: Tetanus:  Patient not certain of date of last tetanus. Says it is probably close to 10 years and he is agreeable to update this today.Tdap given here 06/10/2014 Pneumonia vaccine: Given his chronic alcohol use this meets indication for Pneumovax 23. Recommended he receive this at visit 06/10/2008 he was agreeable. Pneumovax 23 given here 06/10/2014.   No further pneumonia vaccine indicated until age 44. Zostavax--will discuss at age 25   Routine follow-up office visit 6 months or sooner if needed.   [address] [place], [place], [facility] 08/22/2015 3:39 PM

## 2015-08-23 LAB — BASIC METABOLIC PANEL WITH GFR
BUN: 17 mg/dL (ref 7–25)
CO2: 28 mmol/L (ref 20–31)
Calcium: 9.4 mg/dL (ref 8.6–10.3)
Chloride: 101 mmol/L (ref 98–110)
Creat: 0.75 mg/dL (ref 0.70–1.33)
GLUCOSE: 84 mg/dL (ref 70–99)
POTASSIUM: 4.6 mmol/L (ref 3.5–5.3)
SODIUM: 134 mmol/L — AB (ref 135–146)

## 2015-08-23 LAB — HEMOGLOBIN A1C
Hgb A1c MFr Bld: 5.3 % (ref <5.7)
MEAN PLASMA GLUCOSE: 105 mg/dL (ref <117)

## 2015-08-25 ENCOUNTER — Encounter: Payer: Self-pay | Admitting: Family Medicine

## 2015-12-19 ENCOUNTER — Other Ambulatory Visit: Payer: Self-pay | Admitting: Physician Assistant

## 2015-12-19 NOTE — Telephone Encounter (Signed)
Refill appropriate and filled per protocol. 

## 2016-02-21 ENCOUNTER — Other Ambulatory Visit: Payer: Self-pay | Admitting: Physician Assistant

## 2016-02-23 ENCOUNTER — Ambulatory Visit: Payer: BLUE CROSS/BLUE SHIELD | Admitting: Physician Assistant

## 2016-03-14 ENCOUNTER — Encounter: Payer: Self-pay | Admitting: Physician Assistant

## 2016-03-14 ENCOUNTER — Ambulatory Visit (INDEPENDENT_AMBULATORY_CARE_PROVIDER_SITE_OTHER): Payer: Self-pay | Admitting: Physician Assistant

## 2016-03-14 VITALS — BP 120/76 | HR 80 | Temp 97.7°F | Resp 18

## 2016-03-14 DIAGNOSIS — F1010 Alcohol abuse, uncomplicated: Secondary | ICD-10-CM

## 2016-03-14 DIAGNOSIS — F32A Depression, unspecified: Secondary | ICD-10-CM

## 2016-03-14 DIAGNOSIS — E785 Hyperlipidemia, unspecified: Secondary | ICD-10-CM

## 2016-03-14 DIAGNOSIS — R739 Hyperglycemia, unspecified: Secondary | ICD-10-CM

## 2016-03-14 DIAGNOSIS — R220 Localized swelling, mass and lump, head: Secondary | ICD-10-CM

## 2016-03-14 DIAGNOSIS — M1000 Idiopathic gout, unspecified site: Secondary | ICD-10-CM

## 2016-03-14 DIAGNOSIS — R5383 Other fatigue: Secondary | ICD-10-CM

## 2016-03-14 DIAGNOSIS — E46 Unspecified protein-calorie malnutrition: Secondary | ICD-10-CM

## 2016-03-14 DIAGNOSIS — R140 Abdominal distension (gaseous): Secondary | ICD-10-CM

## 2016-03-14 DIAGNOSIS — F329 Major depressive disorder, single episode, unspecified: Secondary | ICD-10-CM

## 2016-03-14 DIAGNOSIS — F411 Generalized anxiety disorder: Secondary | ICD-10-CM

## 2016-03-14 DIAGNOSIS — R630 Anorexia: Secondary | ICD-10-CM

## 2016-03-14 DIAGNOSIS — Z8249 Family history of ischemic heart disease and other diseases of the circulatory system: Secondary | ICD-10-CM

## 2016-03-14 DIAGNOSIS — I10 Essential (primary) hypertension: Secondary | ICD-10-CM

## 2016-03-14 LAB — CBC WITH DIFFERENTIAL/PLATELET
Basophils Absolute: 231 cells/uL — ABNORMAL HIGH (ref 0–200)
Basophils Relative: 1 %
EOS PCT: 0 %
Eosinophils Absolute: 0 cells/uL — ABNORMAL LOW (ref 15–500)
HEMATOCRIT: 19.1 % — AB (ref 38.5–50.0)
HEMOGLOBIN: 6.2 g/dL — AB (ref 13.0–17.0)
LYMPHS ABS: 1386 {cells}/uL (ref 850–3900)
Lymphocytes Relative: 6 %
MCH: 32 pg (ref 27.0–33.0)
MCHC: 32.5 g/dL (ref 32.0–36.0)
MCV: 98.5 fL (ref 80.0–100.0)
MONO ABS: 1155 {cells}/uL — AB (ref 200–950)
MPV: 8.4 fL (ref 7.5–12.5)
Monocytes Relative: 5 %
NEUTROS ABS: 20328 {cells}/uL — AB (ref 1500–7800)
Neutrophils Relative %: 88 %
Platelets: 339 10*3/uL (ref 140–400)
RBC: 1.94 MIL/uL — AB (ref 4.20–5.80)
RDW: 17.1 % — ABNORMAL HIGH (ref 11.0–15.0)
WBC: 23.1 10*3/uL — ABNORMAL HIGH (ref 3.8–10.8)

## 2016-03-14 LAB — TSH: TSH: 3.61 m[IU]/L (ref 0.40–4.50)

## 2016-03-14 MED ORDER — BUPROPION HCL ER (SR) 150 MG PO TB12: ORAL_TABLET | ORAL | 1 refills | Status: DC

## 2016-03-14 NOTE — Progress Notes (Signed)
Patient ID: Jonathan Hodges MRN: [id number], DOB: 03-18-1956, 60 y.o. Date of Encounter: @DATE @  Chief Complaint:  Chief Complaint  Patient presents with  . office visit    depression    HPI: 60 y.o. year old male  presents for above.   THE FOLLOWING IS COPIED FROM HIS LAST OV WITH ME--08/22/2015: Anxiety: In past I have treated him with both Zoloft and Celexa -- he had adverse effects with both of these. He is currently being treated with Cymbalta which is finally working and not causing adverse effects. At his last visit with me in 05/25/13 he was on Cymbalta 60 mg.At  That point he reported that his anxiety and nervousness had decreased somewhat with this medication. At that time I increased the dose to 90 mg. He is taking a 60 mg tablet in addition to a 30 mg. At f/u-- he says that he is feeling much better. He can notice a huge improvement and feels much calmer. 08/22/2015--- States that he currently is without insurance. 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 Says that he stopped the Cymbalta. Says that he really isn't noticing much difference off of the medicine and has been stable off of medicine.  Hypertension: In past he reported that Bystolic was costing [id number] per month. Therefore we stopped the Bystolic and change to atenolol 50 mg. Today he reports he is taking the atenolol daily as directed. 08/22/2015--- even though he is currently with no insurance, he states that he is taking all blood pressure medications as directed. No adverse effects.  Hyper lipidemia: AT 06/2014 OV --he says that he has been out of his cholesterol medication for one month. Says that it was hard for him to get in for an office visit and refills ran out. At [place] 08/22/2015--- he reports that he has been off of the cholesterol medication. Says that he has an off of that a long time-- that he had already stopped it prior to the loss  of insurance.  Hyperglycemia: Has made very little changes in his diet.  Alcohol use: Last visit he said he really had not cut back on this since his prior visit that we have discussed trying to cut back. At that time he was treating 4 beers every night. Was drinking an 18 pack beer over the weekend. 09/2013-- he reports that his alcohol intake has decreased recently but it has only been because of his work schedule--he  has been having to work more. 06/2014--- says that a week night he usually drinks about 2-3 beers. Over a weekend-- usually about a 12 pack. Says  alcohol intake definitely has continued to decrease. 08/2015---- says that he is drinking about 4 beers every night and about 18 pack over a weekend.  Erectile dysfunction--He c/o of this at [place] 09/2013. The following is copied from htat note:Marland Kitchen He says that this has started since his last visit and is wondering if it is secondary to some of the medicines. He is not married but says he does have a girlfriend.Lives alone.  Says he has had no gout flare.   03/14/2016: Says that he has been feeling very depressed. I reviewed that at time of his last visit with me, insurance coverage, employment had been issues-----asked him where things stand with this right now. Says that he worked through April. Then had no work for about for 6 weeks. Then he  got a temporary job for 7 weeks. Has had no work since then. Lives alone. Says that he is taking the Cymbalta and has been taking that since April. Says that he is taking his blood pressure medicines. Says that he got extremely depressed. Says that for the past 3 weeks about all he has eaten is 3 granola bars and Ensure.  Says that he has no appetite. Says that he also has had some diarrhea. Says "I know I'm malnourished. Probably dehydrated too" Says that he is so weak that he has even fallen several times. Says that he has been staying in bed 12-15 hours a day. Says that he was drinking  increased alcohol until the past 3 weeks. Asked how much she has been drinking over the past 3 weeks and he says 4 beers per day. Asked if he has applied for Medicaid. He says that he has not applied for anything like that. I asked if he has any family or friends that can help him and he says no he has no family here. A friend did drive him here for his visit today but doesn't think that same friend has time to take him to social services etc. to look into Medicaid and other programs to help him.   Past Medical History:  Diagnosis Date  . Alcohol abuse   . Anxiety   . Family history of premature coronary artery disease   . Gout   . Hyperglycemia   . Hyperlipidemia   . Hypertension      Home Meds:  Outpatient Medications Prior to Visit  Medication Sig Dispense Refill  . amLODipine (NORVASC) 10 MG tablet Take 1 tablet (10 mg total) by mouth every evening. 30 tablet 5  . atenolol (TENORMIN) 50 MG tablet Take 1 tablet (50 mg total) by mouth daily. 30 tablet 5  . benazepril (LOTENSIN) 40 MG tablet Take 1 tablet (40 mg total) by mouth daily. 30 tablet 5  . DULoxetine (CYMBALTA) 30 MG capsule TAKE 1 CAPSULE (30 MG TOTAL) BY MOUTH DAILY. 30 capsule 1  . DULoxetine (CYMBALTA) 60 MG capsule TAKE 1 CAPSULE (60 MG TOTAL) BY MOUTH DAILY. 30 capsule 1  . acetaminophen (TYLENOL) 325 MG tablet Take 2 tablets (650 mg total) by mouth every 4 (four) hours as needed for headache or mild pain. (Patient not taking: Reported on 03/14/2016)    . allopurinol (ZYLOPRIM) 300 MG tablet TAKE 1 TABLET BY MOUTH DAILY. (Patient not taking: Reported on 03/14/2016) 30 tablet 0  . colchicine 0.6 MG tablet Take 1 tablet (0.6 mg total) by mouth 2 (two) times daily. (Patient not taking: Reported on 03/14/2016) 30 tablet 0  . ibuprofen (ADVIL,MOTRIN) 400 MG tablet Take 1 tablet (400 mg total) by mouth 3 (three) times daily. (Patient not taking: Reported on 03/14/2016) 30 tablet 0  . sildenafil (VIAGRA) 100 MG tablet Take 1/2  to 1 tablet by mouth as needed for erectile dysfunction (Patient not taking: Reported on 03/14/2016) 5 tablet 10  . simvastatin (ZOCOR) 10 MG tablet TAKE 1 TABLET (10 MG TOTAL) BY MOUTH AT BEDTIME. (Patient not taking: Reported on 03/14/2016) 30 tablet 1   No facility-administered medications prior to visit.      Allergies:  Allergies  Allergen Reactions  . Celexa [Citalopram Hydrobromide]     "Moody", "Change in personality", "Agitated"  . Zoloft [Sertraline Hcl]     "Just didn't feel right" "confused, couldn't think clearly"    Social History   Social History  .  Marital status: Single    Spouse name: N/A  . Number of children: N/A  . Years of education: N/A   Occupational History  . Not on file.   Social History Main Topics  . Smoking status: Never Smoker  . Smokeless tobacco: Current User    Types: Snuff  . Alcohol use Yes     Comment: 5 beers daily  . Drug use: No  . Sexual activity: Not on file   Other Topics Concern  . Not on file   Social History Narrative   Divorced. Lives alone.   Works as Clinical biochemist.    Family History  Problem Relation Age of Onset  . Heart disease Mother   . Hyperlipidemia Mother   . Hypertension Father   . Stroke Father   . Hypertension Sister   . Heart disease Brother     MI at 73yo     Review of Systems:  See HPI for pertinent ROS. All other ROS negative.    Physical Exam: Blood pressure 120/76, pulse 80, temperature 97.7 F (36.5 C), temperature source Oral, resp. rate 18., There is no height or weight on file to calculate BMI. General: WNWD WM.  Is in a wheelchair. Looks pale. I barely recognized him. Nurse did not obtain weight today b/c he was in wheelchair. HEENT: Left Submandibular Mass---Approximately 1.25 cm diameter.  No other masses with palpation of the neck. Neck: Supple. No thyromegaly. No Carotid bruit Lungs: Clear bilaterally to auscultation without wheezes, rales, or rhonchi. Breathing is  unlabored. Heart: RRR with S1 S2. No murmurs, rubs, or gallops. Abdomen: His abdomen is distended, hard -- throughout. No palpable mass. No area of tenderness with palpation. Musculoskeletal:  Strength and tone normal for age. Extremities/Skin: Warm and dry. Trace LE edema.  Neuro: Alert and oriented X 3. Moves all extremities spontaneously. CNII-XII grossly in tact. Psych:  Responds to questions appropriately with a normal affect.     ASSESSMENT AND PLAN:  60 y.o. year old male with    Depression, unspecified depression type Will continue Cymbalta. Will add Wellbutrin.  Ideally needs Alchoholic Rehab But I discussed this in the past and he refused.  Now at this point insurance coverage and finances are an additional issue.  Today I was going to have our staff help him contact social services but then once I realized that he had this submandibular mass and abdominal findings on exam decided to obtain these studies first then follow this up. Will add the Wellbutrin and continue Cymbalta and have him follow-up for follow-up visit with me in one week. - buPROPion (WELLBUTRIN SR) 150 MG 12 hr tablet; Take 1 daily for 5 days then take 1 twice daily.  Dispense: 60 tablet; Refill: 1  Alcohol abuse, daily use - COMPLETE METABOLIC PANEL WITH GFR - Anemia panel - CT Abdomen Pelvis W Contrast; Future  Fatigue due to depression - CBC with Differential/Platelet - COMPLETE METABOLIC PANEL WITH GFR - TSH - Anemia panel  Anorexia - CBC with Differential/Platelet - COMPLETE METABOLIC PANEL WITH GFR - Anemia panel - CT Abdomen Pelvis W Contrast; Future  Protein-calorie malnutrition, unspecified severity (HCC) - CBC with Differential/Platelet - COMPLETE METABOLIC PANEL WITH GFR - Anemia panel - CT Abdomen Pelvis W Contrast; Future  Mass of left submandibular region - US Soft Tissue Head/Neck; Future  Abdominal distention - CT Abdomen Pelvis W Contrast; Future   WILL OBTAIN  LABS. WILL OBTAIN U/S NECK WILL OBTAIN CT ABDOMEN/PELVIS  WILL ADD WELLBUTRIN.  WILL HAVE HIM SCHEDULE F/U OV WITH ME IN 1 WEEK.  F/U SOONER IF NEEDED.      THE FOLLOWING IS COPIED FROM OV NOTE 08/22/2015---THE FOLLOWING WAS NOT ADDRESSED AT OV 03/14/2016  Hypertension Blood pressure well controlled. Continue current medicines. Check lab to monitor. - COMPLETE METABOLIC PANEL WITH GFR  Family history of premature coronary artery disease Brother died of MI at age 93. Discussed with patient obtaining a Cardiolite-- he has deferred. He is still having no angina symptoms even with exertion. I've informed him of angina symptoms and seek medical evaluation immediately if he does develop such symptoms.  Hyperlipidemia At OV 08/22/2015---He reports he has been off cholesterol med a very long time.  Reviewed that he had hospitalization 12/2014--and lipid panel was drawn then. He states that he was not taking the cholesterol medication at the time of that hospitalization 12/2014. Reviewed that lipid panel looked good at that time with triglyceride 155, HDL 52, LDL 99. He currently has no insurance so will not check FLP and make him pay for this. Lipid panel was ok off of cholesterol medication 12/2014.   Hyperglycemia - COMPLETE METABOLIC PANEL WITH GFR - Hemoglobin A1c   Gout Currently controlled. No recent flares. Uric Acid level 4.9 at lab 03/05/13. 08/22/2015--- currently with no insurance coverage and reports no gout flares so we'll hold off on rechecking uric acid level for cost purposes.   Erectile dysfunction The following is copied from OV Note 09/2013. : Discussed that the erectile dysfunction could be secondary to the Cymbalta or the atenolol. I really do not want to stop the Cymbalta. In The past he had adverse effects of Zoloft and Celexa. Finally the Cymbalta is working and causing no adverse effects. Asol, I really do not want to stop the atenolol. Do not have many options for  his blood pressure . Could not use Bystolic sec to cost.  HCTZ can also cause ED He is on Benazepril 40 mg which is max dose. He is on Norvasc 10 mg which is max dose. - sildenafil (VIAGRA) 100 MG tablet; Take 0.5-1 tablets (50-100 mg total) by mouth daily as needed for erectile dysfunction.  Dispense: 3 tablet; Refill: 11  GAD (generalized anxiety disorder) He is now off Cymbalta.   Alcohol abuse In Past:  Discussed that  while he has decreased his amount of alcohol secondary to his work schedule, to please try to keep the amount decreased. Also discussed with him going to alcoholics anonymous going to some type of rehabilitation program. He is not interested at all. Says  he "has been drinking all of his life.--and that's just the way I am'"  06/2014: Pt says he has decreased alcohol intake a lot.   Office visit 03/05/13 he reported that his toes on both feet felt numb and tingly at night and early in the morning. I did extensive lab panel was all normal. I was unable to line 80 thiamine because it requires specialty with special conditions. Obtain this now. Patient states that the tingling in his feet has resolved.  08/22/2015--- he reports alcohol use is back up to where it was in the past. He is well aware of all of the medical problems that this can cause and the need to decrease/stop alcohol use.  colorectal cancer screening: He has never had screening colonoscopy. I discussed risks benefits and me he repeatedly defers. Also discussed Hemoccult testing but he is deferred.  Prostate cancer screening: Screening PSA was done in  03/05/13 normal. 08/22/2015--- currently without insurance so we'll hold off on checking PSA   Immunizations: Tetanus:  Patient not certain of date of last tetanus. Says it is probably close to 10 years and he is agreeable to update this today.Tdap given here 06/10/2014 Pneumonia vaccine: Given his chronic alcohol use this meets indication for Pneumovax 23.  Recommended he receive this at visit 06/10/2008 he was agreeable. Pneumovax 23 given here 06/10/2014.   No further pneumonia vaccine indicated until age 20. Zostavax--will discuss at age 66   Routine follow-up office visit 6 months or sooner if needed.   [address] [place], [place], [facility] 03/14/2016 12:18 PM

## 2016-03-15 ENCOUNTER — Emergency Department (HOSPITAL_COMMUNITY): Payer: Medicaid Other

## 2016-03-15 ENCOUNTER — Encounter (HOSPITAL_COMMUNITY): Payer: Self-pay | Admitting: Emergency Medicine

## 2016-03-15 ENCOUNTER — Inpatient Hospital Stay (HOSPITAL_COMMUNITY): Payer: Medicaid Other

## 2016-03-15 ENCOUNTER — Inpatient Hospital Stay (HOSPITAL_COMMUNITY)
Admission: EM | Admit: 2016-03-15 | Discharge: 2016-03-19 | DRG: 435 | Disposition: A | Discharge status: Hospice | Payer: Medicaid Other | Attending: Internal Medicine | Admitting: Internal Medicine

## 2016-03-15 ENCOUNTER — Telehealth: Payer: Self-pay

## 2016-03-15 DIAGNOSIS — I959 Hypotension, unspecified: Secondary | ICD-10-CM | POA: Diagnosis not present

## 2016-03-15 DIAGNOSIS — M109 Gout, unspecified: Secondary | ICD-10-CM | POA: Diagnosis present

## 2016-03-15 DIAGNOSIS — K7011 Alcoholic hepatitis with ascites: Secondary | ICD-10-CM | POA: Diagnosis present

## 2016-03-15 DIAGNOSIS — F10288 Alcohol dependence with other alcohol-induced disorder: Secondary | ICD-10-CM | POA: Diagnosis present

## 2016-03-15 DIAGNOSIS — Z9119 Patient's noncompliance with other medical treatment and regimen: Secondary | ICD-10-CM

## 2016-03-15 DIAGNOSIS — D649 Anemia, unspecified: Secondary | ICD-10-CM

## 2016-03-15 DIAGNOSIS — C7889 Secondary malignant neoplasm of other digestive organs: Secondary | ICD-10-CM | POA: Diagnosis present

## 2016-03-15 DIAGNOSIS — I10 Essential (primary) hypertension: Secondary | ICD-10-CM | POA: Diagnosis present

## 2016-03-15 DIAGNOSIS — R1084 Generalized abdominal pain: Secondary | ICD-10-CM

## 2016-03-15 DIAGNOSIS — F1010 Alcohol abuse, uncomplicated: Secondary | ICD-10-CM | POA: Diagnosis present

## 2016-03-15 DIAGNOSIS — R180 Malignant ascites: Secondary | ICD-10-CM

## 2016-03-15 DIAGNOSIS — K7031 Alcoholic cirrhosis of liver with ascites: Secondary | ICD-10-CM | POA: Diagnosis present

## 2016-03-15 DIAGNOSIS — E875 Hyperkalemia: Secondary | ICD-10-CM | POA: Diagnosis present

## 2016-03-15 DIAGNOSIS — E872 Acidosis, unspecified: Secondary | ICD-10-CM

## 2016-03-15 DIAGNOSIS — Z66 Do not resuscitate: Secondary | ICD-10-CM | POA: Diagnosis not present

## 2016-03-15 DIAGNOSIS — E860 Dehydration: Secondary | ICD-10-CM

## 2016-03-15 DIAGNOSIS — C787 Secondary malignant neoplasm of liver and intrahepatic bile duct: Secondary | ICD-10-CM | POA: Diagnosis present

## 2016-03-15 DIAGNOSIS — E222 Syndrome of inappropriate secretion of antidiuretic hormone: Secondary | ICD-10-CM | POA: Diagnosis present

## 2016-03-15 DIAGNOSIS — E785 Hyperlipidemia, unspecified: Secondary | ICD-10-CM | POA: Diagnosis present

## 2016-03-15 DIAGNOSIS — C229 Malignant neoplasm of liver, not specified as primary or secondary: Secondary | ICD-10-CM | POA: Diagnosis present

## 2016-03-15 DIAGNOSIS — R188 Other ascites: Secondary | ICD-10-CM | POA: Diagnosis present

## 2016-03-15 DIAGNOSIS — C7802 Secondary malignant neoplasm of left lung: Secondary | ICD-10-CM | POA: Diagnosis present

## 2016-03-15 DIAGNOSIS — C772 Secondary and unspecified malignant neoplasm of intra-abdominal lymph nodes: Secondary | ICD-10-CM | POA: Diagnosis present

## 2016-03-15 DIAGNOSIS — F10239 Alcohol dependence with withdrawal, unspecified: Secondary | ICD-10-CM | POA: Diagnosis present

## 2016-03-15 DIAGNOSIS — F329 Major depressive disorder, single episode, unspecified: Secondary | ICD-10-CM | POA: Diagnosis present

## 2016-03-15 DIAGNOSIS — D62 Acute posthemorrhagic anemia: Secondary | ICD-10-CM | POA: Diagnosis present

## 2016-03-15 DIAGNOSIS — K922 Gastrointestinal hemorrhage, unspecified: Secondary | ICD-10-CM

## 2016-03-15 DIAGNOSIS — F411 Generalized anxiety disorder: Secondary | ICD-10-CM | POA: Diagnosis present

## 2016-03-15 DIAGNOSIS — C7971 Secondary malignant neoplasm of right adrenal gland: Secondary | ICD-10-CM | POA: Diagnosis present

## 2016-03-15 DIAGNOSIS — C799 Secondary malignant neoplasm of unspecified site: Secondary | ICD-10-CM | POA: Diagnosis present

## 2016-03-15 DIAGNOSIS — Z515 Encounter for palliative care: Secondary | ICD-10-CM | POA: Diagnosis not present

## 2016-03-15 DIAGNOSIS — E43 Unspecified severe protein-calorie malnutrition: Secondary | ICD-10-CM | POA: Diagnosis present

## 2016-03-15 DIAGNOSIS — C7801 Secondary malignant neoplasm of right lung: Secondary | ICD-10-CM | POA: Diagnosis present

## 2016-03-15 DIAGNOSIS — E871 Hypo-osmolality and hyponatremia: Secondary | ICD-10-CM | POA: Diagnosis present

## 2016-03-15 DIAGNOSIS — R531 Weakness: Secondary | ICD-10-CM

## 2016-03-15 DIAGNOSIS — Z8249 Family history of ischemic heart disease and other diseases of the circulatory system: Secondary | ICD-10-CM

## 2016-03-15 DIAGNOSIS — Z888 Allergy status to other drugs, medicaments and biological substances status: Secondary | ICD-10-CM

## 2016-03-15 DIAGNOSIS — Z720 Tobacco use: Secondary | ICD-10-CM

## 2016-03-15 DIAGNOSIS — Z79899 Other long term (current) drug therapy: Secondary | ICD-10-CM

## 2016-03-15 LAB — BASIC METABOLIC PANEL
Anion gap: 8 (ref 5–15)
Anion gap: 7 (ref 5–15)
BUN: 15 mg/dL (ref 6–20)
BUN: 14 mg/dL (ref 6–20)
CO2: 22 mmol/L (ref 22–32)
CO2: 22 mmol/L (ref 22–32)
Calcium: 6.8 mg/dL — ABNORMAL LOW (ref 8.9–10.3)
Calcium: 6.8 mg/dL — ABNORMAL LOW (ref 8.9–10.3)
Chloride: 80 mmol/L — ABNORMAL LOW (ref 101–111)
Chloride: 82 mmol/L — ABNORMAL LOW (ref 101–111)
Creatinine, Ser: 0.67 mg/dL (ref 0.61–1.24)
Creatinine, Ser: 0.66 mg/dL (ref 0.61–1.24)
GFR calc Af Amer: >60 mL/min (ref >60)
GFR calc Af Amer: >60 mL/min (ref >60)
GFR calc non Af Amer: >60 mL/min (ref >60)
GFR calc non Af Amer: >60 mL/min (ref >60)
Glucose, Bld: 78 mg/dL (ref 65–99)
Glucose, Bld: 72 mg/dL (ref 65–99)
Potassium: 5 mmol/L (ref 3.5–5.1)
Potassium: 4.8 mmol/L (ref 3.5–5.1)
Sodium: 110 mmol/L — CL (ref 135–145)
Sodium: 111 mmol/L — CL (ref 135–145)

## 2016-03-15 LAB — BODY FLUID CELL COUNT WITH DIFFERENTIAL
EOS FL: 0 %
Lymphs, Fluid: 3 %
MONOCYTE-MACROPHAGE-SEROUS FLUID: 6 % — AB (ref 50–90)
NEUTROPHIL FLUID: 91 % — AB (ref 0–25)
Total Nucleated Cell Count, Fluid: 7015 cu mm — ABNORMAL HIGH (ref 0–1000)

## 2016-03-15 LAB — COMPREHENSIVE METABOLIC PANEL
ALT: 201 U/L — ABNORMAL HIGH (ref 17–63)
ANION GAP: 14 (ref 5–15)
AST: 570 U/L — ABNORMAL HIGH (ref 15–41)
Albumin: 1.8 g/dL — ABNORMAL LOW (ref 3.5–5.0)
Alkaline Phosphatase: 322 U/L — ABNORMAL HIGH (ref 38–126)
BUN: 17 mg/dL (ref 6–20)
CHLORIDE: 75 mmol/L — AB (ref 101–111)
CO2: 20 mmol/L — ABNORMAL LOW (ref 22–32)
Calcium: 7.4 mg/dL — ABNORMAL LOW (ref 8.9–10.3)
Creatinine, Ser: 0.87 mg/dL (ref 0.61–1.24)
GFR calc non Af Amer: >60 mL/min (ref >60)
Glucose, Bld: 89 mg/dL (ref 65–99)
Potassium: 5.4 mmol/L — ABNORMAL HIGH (ref 3.5–5.1)
SODIUM: 109 mmol/L — AB (ref 135–145)
Total Bilirubin: 3.3 mg/dL — ABNORMAL HIGH (ref 0.3–1.2)
Total Protein: 6.1 g/dL — ABNORMAL LOW (ref 6.5–8.1)

## 2016-03-15 LAB — COMPLETE METABOLIC PANEL WITH GFR
ALT: 169 U/L — ABNORMAL HIGH (ref 9–46)
AST: 511 U/L — AB (ref 10–35)
Albumin: 2 g/dL — ABNORMAL LOW (ref 3.6–5.1)
Alkaline Phosphatase: 373 U/L — ABNORMAL HIGH (ref 40–115)
BUN: 15 mg/dL (ref 7–25)
CO2: 19 mmol/L — AB (ref 20–31)
Calcium: 7.4 mg/dL — ABNORMAL LOW (ref 8.6–10.3)
Creat: 0.92 mg/dL (ref 0.70–1.25)
Glucose, Bld: 84 mg/dL (ref 70–99)
Potassium: 5.3 mmol/L (ref 3.5–5.3)
Total Bilirubin: 2.5 mg/dL — ABNORMAL HIGH (ref 0.2–1.2)
Total Protein: 5.8 g/dL — ABNORMAL LOW (ref 6.1–8.1)

## 2016-03-15 LAB — ANEMIA PANEL
%SAT: 18 % (ref 15–60)
ABS RETIC: 192060 {cells}/uL — AB (ref 25000–90000)
FERRITIN: 2563 ng/mL — AB (ref 20–380)
Folate: 6.3 ng/mL (ref >5.4)
IRON: 35 ug/dL — AB (ref 50–180)
RBC.: 1.94 MIL/uL — ABNORMAL LOW (ref 4.20–5.80)
RETIC CT PCT: 9.9 %
TIBC: 194 ug/dL — ABNORMAL LOW (ref 250–425)
UIBC: 159 ug/dL (ref 125–400)

## 2016-03-15 LAB — PREPARE RBC (CROSSMATCH)

## 2016-03-15 LAB — GRAM STAIN

## 2016-03-15 LAB — CBC WITH DIFFERENTIAL/PLATELET
BASOS ABS: 0 10*3/uL (ref 0.0–0.1)
Basophils Relative: 0 %
Eosinophils Absolute: 0 10*3/uL (ref 0.0–0.7)
Eosinophils Relative: 0 %
HCT: 16.6 % — ABNORMAL LOW (ref 39.0–52.0)
Hemoglobin: 5.6 g/dL — CL (ref 13.0–17.0)
LYMPHS ABS: 1.8 10*3/uL (ref 0.7–4.0)
Lymphocytes Relative: 6 %
MCH: 32.6 pg (ref 26.0–34.0)
MCHC: 33.7 g/dL (ref 30.0–36.0)
MCV: 96.5 fL (ref 78.0–100.0)
MONOS PCT: 7 %
Monocytes Absolute: 2.1 10*3/uL — ABNORMAL HIGH (ref 0.1–1.0)
NEUTROS PCT: 87 %
Neutro Abs: 25.4 10*3/uL — ABNORMAL HIGH (ref 1.7–7.7)
PLATELETS: 315 10*3/uL (ref 150–400)
RBC: 1.72 MIL/uL — AB (ref 4.22–5.81)
RDW: 17.7 % — ABNORMAL HIGH (ref 11.5–15.5)
WBC: 29.3 10*3/uL — AB (ref 4.0–10.5)

## 2016-03-15 LAB — ALBUMIN, FLUID (OTHER)

## 2016-03-15 LAB — PROTIME-INR
INR: 1.43
PROTHROMBIN TIME: 17.6 s — AB (ref 11.4–15.2)

## 2016-03-15 LAB — LACTIC ACID, PLASMA
LACTIC ACID, VENOUS: 2.6 mmol/L — AB (ref 0.5–1.9)
Lactic Acid, Venous: 6.1 mmol/L (ref 0.5–1.9)

## 2016-03-15 LAB — POC OCCULT BLOOD, ED: FECAL OCCULT BLD: POSITIVE — AB

## 2016-03-15 LAB — PROTEIN, BODY FLUID: Total protein, fluid: <3 g/dL

## 2016-03-15 LAB — ACETAMINOPHEN LEVEL: Acetaminophen (Tylenol), Serum: <10 ug/mL — ABNORMAL LOW (ref 10–30)

## 2016-03-15 LAB — CREATININE, URINE, RANDOM: Creatinine, Urine: 59.58 mg/dL

## 2016-03-15 LAB — MRSA PCR SCREENING: MRSA BY PCR: NEGATIVE

## 2016-03-15 LAB — SODIUM, URINE, RANDOM: Sodium, Ur: <10 mmol/L

## 2016-03-15 LAB — OSMOLALITY, URINE: Osmolality, Ur: 392 mOsm/kg (ref 300–900)

## 2016-03-15 LAB — AMMONIA: AMMONIA: 22 umol/L (ref 9–35)

## 2016-03-15 LAB — OSMOLALITY: Osmolality: 239 mOsm/kg — CL (ref 275–295)

## 2016-03-15 LAB — ABO/RH: ABO/RH(D): O POS

## 2016-03-15 MED ORDER — ENSURE ENLIVE PO LIQD
237.0000 mL | Freq: Two times a day (BID) | ORAL | Status: DC
  Administered 2016-03-16: 237 mL via ORAL

## 2016-03-15 MED ORDER — SODIUM CHLORIDE 0.9 % IV SOLN
Freq: Once | INTRAVENOUS | Status: AC
  Administered 2016-03-15: 13:00:00 via INTRAVENOUS

## 2016-03-15 MED ORDER — THIAMINE HCL 100 MG/ML IJ SOLN
100.0000 mg | Freq: Every day | INTRAMUSCULAR | Status: DC
  Administered 2016-03-16: 100 mg via INTRAVENOUS
  Filled 2016-03-15: qty 2

## 2016-03-15 MED ORDER — HEPARIN SODIUM (PORCINE) 5000 UNIT/ML IJ SOLN: 5000.0000 [IU] | Freq: Three times a day (TID) | INTRAMUSCULAR | Status: DC

## 2016-03-15 MED ORDER — VANCOMYCIN HCL 10 G IV SOLR
1500.0000 mg | Freq: Once | INTRAVENOUS | Status: AC
  Administered 2016-03-15: 1500 mg via INTRAVENOUS
  Filled 2016-03-15: qty 1500

## 2016-03-15 MED ORDER — SODIUM CHLORIDE 0.9 % IV BOLUS (SEPSIS)
1000.0000 mL | Freq: Once | INTRAVENOUS | Status: AC
  Administered 2016-03-15: 1000 mL via INTRAVENOUS

## 2016-03-15 MED ORDER — IOPAMIDOL (ISOVUE-370) INJECTION 76%: 100.0000 mL | Freq: Once | INTRAVENOUS | Status: DC | PRN

## 2016-03-15 MED ORDER — CEFEPIME HCL 2 G IJ SOLR
2.0000 g | Freq: Once | INTRAMUSCULAR | Status: AC
  Administered 2016-03-15: 2 g via INTRAVENOUS
  Filled 2016-03-15: qty 2

## 2016-03-15 MED ORDER — LORAZEPAM 1 MG PO TABS: 1.0000 mg | ORAL_TABLET | Freq: Four times a day (QID) | ORAL | Status: AC | PRN

## 2016-03-15 MED ORDER — IOPAMIDOL (ISOVUE-300) INJECTION 61%
100.0000 mL | Freq: Once | INTRAVENOUS | Status: AC | PRN
  Administered 2016-03-15: 100 mL via INTRAVENOUS

## 2016-03-15 MED ORDER — VANCOMYCIN HCL 10 G IV SOLR
1250.0000 mg | Freq: Once | INTRAVENOUS | Status: DC
  Filled 2016-03-15: qty 1250

## 2016-03-15 MED ORDER — LORAZEPAM 2 MG/ML IJ SOLN
0.0000 mg | Freq: Two times a day (BID) | INTRAMUSCULAR | Status: AC
  Administered 2016-03-17 (×2): 4 mg via INTRAVENOUS
  Administered 2016-03-18 (×2): 2 mg via INTRAVENOUS
  Filled 2016-03-15: qty 1
  Filled 2016-03-15: qty 2
  Filled 2016-03-15 (×2): qty 1
  Filled 2016-03-15: qty 2

## 2016-03-15 MED ORDER — LORAZEPAM 2 MG/ML IJ SOLN
0.0000 mg | Freq: Four times a day (QID) | INTRAMUSCULAR | Status: AC
  Administered 2016-03-15: 1 mg via INTRAVENOUS
  Administered 2016-03-15 – 2016-03-17 (×7): 2 mg via INTRAVENOUS
  Filled 2016-03-15: qty 2
  Filled 2016-03-15 (×7): qty 1

## 2016-03-15 MED ORDER — SODIUM CHLORIDE 0.9 % IV SOLN
1.0000 g | Freq: Once | INTRAVENOUS | Status: AC
  Administered 2016-03-15: 1 g via INTRAVENOUS
  Filled 2016-03-15: qty 10

## 2016-03-15 MED ORDER — LORAZEPAM 2 MG/ML IJ SOLN
1.0000 mg | Freq: Four times a day (QID) | INTRAMUSCULAR | Status: AC | PRN
  Administered 2016-03-16: 1 mg via INTRAVENOUS

## 2016-03-15 MED ORDER — VITAMIN B-1 100 MG PO TABS
100.0000 mg | ORAL_TABLET | Freq: Every day | ORAL | Status: DC
  Filled 2016-03-15: qty 1

## 2016-03-15 MED ORDER — ADULT MULTIVITAMIN W/MINERALS CH
1.0000 | ORAL_TABLET | Freq: Every day | ORAL | Status: DC
  Administered 2016-03-16: 1 via ORAL
  Filled 2016-03-15: qty 1

## 2016-03-15 MED ORDER — SODIUM CHLORIDE 0.9% FLUSH
3.0000 mL | Freq: Two times a day (BID) | INTRAVENOUS | Status: DC
  Administered 2016-03-16 – 2016-03-19 (×7): 3 mL via INTRAVENOUS

## 2016-03-15 MED ORDER — FOLIC ACID 1 MG PO TABS
1.0000 mg | ORAL_TABLET | Freq: Every day | ORAL | Status: DC
  Administered 2016-03-16: 1 mg via ORAL
  Filled 2016-03-15: qty 1

## 2016-03-15 NOTE — Consult Note (Signed)
Referring Provider: [facility] ER Provider Primary Care Physician:  Karis Juba, PA-C Primary Gastroenterologist:  Dr. Gala Romney  Date of Admission: 03/15/16 Date of Consultation: 03/15/16  Reason for Consultation:  UGI bleed, liver decompensation  HPI:  Jonathan Hodges is a 60 y.o. male with a past medical history of alcohol abuse, anxiety, and cardiac history. He presented to the emergency department complaining of weakness, fatigue, and was referred by primary care who noted a critically low hemoglobin and sodium.  Today he states he drinks 4-6 beers a day and has "for pretty much my whole lofe." He states he has never been worked up for liver disease. When he presented he was having weakness, fatigue, denies chest pain and shortness of breath. He had been having dark black melanotic type stools with every bowel movement for the past 2-1/2-3 weeks. Also with some intermittent hematochezia. However, denies abdominal pain, nausea, vomiting. He denies yellowing of his skin or eyes, no confusion unless he falls which she has fallen 4-5 times in the past couple weeks due to weakness. Denies generalized pruritus. Began noticing progressive abdominal distention about 2 weeks ago. Denies any other upper or lower GI symptoms.  ED workup found heme positive stool, CBC 5.6, white blood cell count 29.3 with absolute neutrophils of 25.4 and absolute monocytes 2.1. Also noted giant platelets and greater than 20% bands on blood smear examination. Crit ago sodium of 109, potassium elevated at 5.4, chloride low at 75, low calcium 7.4, low albumin 1.8, markedly elevated AST/ALT at 570/201, increased bilirubin at 3.3, increased alkaline phosphatase at 322. His creatinine however is normal at 0.87. Lactic acid positive at 6.1, INR 1.43, negative acetaminophen level, ammonia normal at 22.  CT of the abdomen and pelvis was completed and a just released final reading found widespread metastatic disease in the lungs, liver,  abdominal nodes, right adrenal gland, and spleen. Esophageal adenocarcinoma versus hepatocellular carcinoma is favored primary. Moderate ascites. One of the large left liver masses has extracapsular growth. Cirrhotic morphology of the liver.  Past Medical History:  Diagnosis Date  . Alcohol abuse   . Anxiety   . Family history of premature coronary artery disease   . Gout   . Hyperglycemia   . Hyperlipidemia   . Hypertension     Past Surgical History:  Procedure Laterality Date  . arm fracture      Prior to Admission medications   Medication Sig Start Date End Date Taking? Authorizing Provider  acetaminophen (TYLENOL) 325 MG tablet Take 2 tablets (650 mg total) by mouth every 4 (four) hours as needed for headache or mild pain. Patient not taking: Reported on 03/14/2016 12/27/14   Erlene Quan, PA-C  allopurinol (ZYLOPRIM) 300 MG tablet TAKE 1 TABLET BY MOUTH DAILY. Patient not taking: Reported on 03/14/2016 08/22/15   Orlena Sheldon, PA-C  amLODipine (NORVASC) 10 MG tablet Take 1 tablet (10 mg total) by mouth every evening. 08/22/15   Orlena Sheldon, PA-C  atenolol (TENORMIN) 50 MG tablet Take 1 tablet (50 mg total) by mouth daily. 08/22/15   Lonie Peak Dixon, PA-C  benazepril (LOTENSIN) 40 MG tablet Take 1 tablet (40 mg total) by mouth daily. 08/22/15   Lonie Peak Dixon, PA-C  buPROPion (WELLBUTRIN SR) 150 MG 12 hr tablet Take 1 daily for 5 days then take 1 twice daily. 03/14/16   Lonie Peak Dixon, PA-C  colchicine 0.6 MG tablet Take 1 tablet (0.6 mg total) by mouth 2 (two) times daily. Patient not taking:  Reported on 03/14/2016 12/27/14   Brett Canales, PA-C  DULoxetine (CYMBALTA) 30 MG capsule TAKE 1 CAPSULE (30 MG TOTAL) BY MOUTH DAILY. 02/22/16   Lonie Peak Dixon, PA-C  DULoxetine (CYMBALTA) 60 MG capsule TAKE 1 CAPSULE (60 MG TOTAL) BY MOUTH DAILY. 02/22/16   Lonie Peak Dixon, PA-C  ibuprofen (ADVIL,MOTRIN) 400 MG tablet Take 1 tablet (400 mg total) by mouth 3 (three) times daily. Patient not taking:  Reported on 03/14/2016 12/27/14   Erlene Quan, PA-C  sildenafil (VIAGRA) 100 MG tablet Take 1/2 to 1 tablet by mouth as needed for erectile dysfunction Patient not taking: Reported on 03/14/2016 06/10/14   Orlena Sheldon, PA-C  simvastatin (ZOCOR) 10 MG tablet TAKE 1 TABLET (10 MG TOTAL) BY MOUTH AT BEDTIME. Patient not taking: Reported on 03/14/2016 06/10/14   Orlena Sheldon, PA-C    Current Facility-Administered Medications  Medication Dose Route Frequency Provider Last Rate Last Dose  . 0.9 %  sodium chloride infusion   Intravenous Once Merrily Pew, MD      . 0.9 %  sodium chloride infusion   Intravenous Once Merrily Pew, MD      . calcium gluconate 1 g in sodium chloride 0.9 % 100 mL IVPB  1 g Intravenous Once Merrily Pew, MD 330 mL/hr at 03/15/16 1209 1 g at 03/15/16 1209  . ceFEPIme (MAXIPIME) 2 g in dextrose 5 % 50 mL IVPB  2 g Intravenous Once Merrily Pew, MD 100 mL/hr at 03/15/16 1206 2 g at 03/15/16 1206  . iopamidol (ISOVUE-370) 76 % injection 100 mL  100 mL Intravenous Once PRN Merrily Pew, MD      . vancomycin (VANCOCIN) 1,500 mg in sodium chloride 0.9 % 500 mL IVPB  1,500 mg Intravenous Once Merrily Pew, MD       Current Outpatient Prescriptions  Medication Sig Dispense Refill  . acetaminophen (TYLENOL) 325 MG tablet Take 2 tablets (650 mg total) by mouth every 4 (four) hours as needed for headache or mild pain. (Patient not taking: Reported on 03/14/2016)    . allopurinol (ZYLOPRIM) 300 MG tablet TAKE 1 TABLET BY MOUTH DAILY. (Patient not taking: Reported on 03/14/2016) 30 tablet 0  . amLODipine (NORVASC) 10 MG tablet Take 1 tablet (10 mg total) by mouth every evening. 30 tablet 5  . atenolol (TENORMIN) 50 MG tablet Take 1 tablet (50 mg total) by mouth daily. 30 tablet 5  . benazepril (LOTENSIN) 40 MG tablet Take 1 tablet (40 mg total) by mouth daily. 30 tablet 5  . buPROPion (WELLBUTRIN SR) 150 MG 12 hr tablet Take 1 daily for 5 days then take 1 twice daily. 60 tablet 1  .  colchicine 0.6 MG tablet Take 1 tablet (0.6 mg total) by mouth 2 (two) times daily. (Patient not taking: Reported on 03/14/2016) 30 tablet 0  . DULoxetine (CYMBALTA) 30 MG capsule TAKE 1 CAPSULE (30 MG TOTAL) BY MOUTH DAILY. 30 capsule 1  . DULoxetine (CYMBALTA) 60 MG capsule TAKE 1 CAPSULE (60 MG TOTAL) BY MOUTH DAILY. 30 capsule 1  . ibuprofen (ADVIL,MOTRIN) 400 MG tablet Take 1 tablet (400 mg total) by mouth 3 (three) times daily. (Patient not taking: Reported on 03/14/2016) 30 tablet 0  . sildenafil (VIAGRA) 100 MG tablet Take 1/2 to 1 tablet by mouth as needed for erectile dysfunction (Patient not taking: Reported on 03/14/2016) 5 tablet 10  . simvastatin (ZOCOR) 10 MG tablet TAKE 1 TABLET (10 MG TOTAL) BY MOUTH AT BEDTIME. (Patient not  taking: Reported on 03/14/2016) 30 tablet 1    Allergies as of 03/15/2016 - Review Complete 03/15/2016  Allergen Reaction Noted  . Celexa [citalopram hydrobromide]  03/05/2013  . Zoloft [sertraline hcl]  03/05/2013    Family History  Problem Relation Age of Onset  . Heart disease Mother   . Hyperlipidemia Mother   . Hypertension Father   . Stroke Father   . Hypertension Sister   . Heart disease Brother     MI at 41yo    Social History   Social History  . Marital status: Single    Spouse name: N/A  . Number of children: N/A  . Years of education: N/A   Occupational History  . Not on file.   Social History Main Topics  . Smoking status: Never Smoker  . Smokeless tobacco: Current User    Types: Snuff  . Alcohol use Yes     Comment: 5 beers daily  . Drug use: No  . Sexual activity: Not on file   Other Topics Concern  . Not on file   Social History Narrative   Divorced. Lives alone.   Works as Clinical biochemist.    Review of Systems: General: Negative for fever, chills. Admits decreased apetite, fatigue, weakness, and falls. Eyes: Negative for vision changes.  ENT: Negative for hoarseness, difficulty swallowing. CV: Negative for  chest pain, angina, palpitations, peripheral edema.  Respiratory: Negative for dyspnea at rest, cough, sputum, wheezing.  GI: See history of present illness. Derm: Negative for rash or itching.  Neuro: Negative for abnormal sensation, seizure, memory loss, confusion. Admits weakness. Endo: Negative for unusual weight change.  Heme: Negative for bruising or bleeding. Allergy: Negative for rash or hives.  Physical Exam: Vital signs in last 24 hours: Temp:  [97.7 F (36.5 C)] 97.7 F (36.5 C) (10/12 1027) Pulse Rate:  [88] 88 (10/12 1030) Resp:  [18-20] 19 (10/12 1200) BP: (93-103)/(63-66) 93/63 (10/12 1200) SpO2:  [98 %-99 %] 99 % (10/12 1030) Weight:  [175 lb (79.4 kg)] 175 lb (79.4 kg) (10/12 1029)   General:   Alert,  Well-developed, well-nourished, pleasant and cooperative in NAD. His BP is noted to be "soft" with SBP between 90 and 100. Heart rate 90s. Head:  Normocephalic and atraumatic. Eyes:  Noted scleral icterus. Ears:  Normal auditory acuity. Nose:  No deformity, discharge, or lesions. Lungs:  Clear throughout to auscultation. No wheezes, crackles, or rhonchi. No acute distress. Heart:  Regular rate and rhythm; no murmurs, clicks, rubs, or gallops. Abdomen:  His abdomen is distended. There is an obvious mid-abdominal mass around the umbilicus with an appreciated ridge/border more right-sided. Abdomen is firm. No appreciated splenomegaly or hepatomegaly. Normal bowel sounds without rebound. Some abdominal TTP.  Rectal:  Deferred.   Msk:  Symmetrical without gross deformities. Pulses:  Normal bilateral DP pulses noted. Extremities:  Without clubbing or edema. Neurologic:  Alert and  oriented x4; grossly normal neurologically. Skin:  Mild jaundice noted. Psych:  Alert and cooperative. Normal mood and affect.  Intake/Output from previous day: No intake/output data recorded. Intake/Output this shift: No intake/output data recorded.  Lab Results:  Recent Labs   03/14/16 0843 03/15/16 1041  WBC 23.1* 29.3*  HGB 6.2* 5.6*  HCT 19.1* 16.6*  PLT 339 315   BMET  Recent Labs  03/14/16 0843 03/15/16 1041  NA <115* 109*  K 5.3 5.4*  CL <80* 75*  CO2 19* 20*  GLUCOSE 84 89  BUN 15 17  CREATININE 0.92 0.87  CALCIUM 7.4* 7.4*   LFT  Recent Labs  03/14/16 0843 03/15/16 1041  PROT 5.8* 6.1*  ALBUMIN 2.0* 1.8*  AST 511* 570*  ALT 169* 201*  ALKPHOS 373* 322*  BILITOT 2.5* 3.3*   PT/INR  Recent Labs  03/15/16 1041  LABPROT 17.6*  INR 1.43   Hepatitis Panel No results for input(s): HEPBSAG, HCVAB, HEPAIGM, HEPBIGM in the last 72 hours. C-Diff No results for input(s): CDIFFTOX in the last 72 hours.  Studies/Results: Dg Chest 2 View  Result Date: 03/15/2016 CLINICAL DATA:  Weakness for 10 days. EXAM: CHEST  2 VIEW COMPARISON:  Radiographs of December 26, 2014. FINDINGS: The heart size and mediastinal contours are within normal limits. Atherosclerosis of thoracic aorta is noted. No pneumothorax or pleural effusion is noted. Rounded density is seen in left midlung, with another rounded density seen in right lower lobe ; these are concerning for metastatic lesions. Smaller nodule is noted in right upper lobe. The visualized skeletal structures are unremarkable. IMPRESSION: Bilateral pulmonary nodules are noted concerning for metastatic disease. CT scan of the chest is recommended for further evaluation. Aortic atherosclerosis. Electronically Signed   By: Marijo Conception, M.D.   On: 03/15/2016 11:14    Impression: 60 year old male presents with 2-3 weeks of melena and acute decline in hgb, multiple electrolyte abnormalities including sodium of 109. LFTs all elevated, noted jaundice. Mass appreciated on physical exam. 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 CT ominous with "innumerable" liver masses and widespread metastasis. Primary likely esophageal versus liver (per radiology). 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  He is not a candidate for any type of endoscopic procedure at this time with a sodium of 109. Would need mid to upper 120s AT A MINIMUM to consider procedure.  LFT elevation and jaundice likely underlying cirrhosis with possible shock liver with blood loss and "soft" BP and/or compounded by liver cancer.  Overall his prognosis is very poor.  Plan: 1. Transfuse as necessary 2. Supportive measures 3. Will defer on hyponatremia to hospitalist 4. Will continue to follow for any input we can offer 5. Can consider endoscopic evaluation in the future IF he makes significant improvement 6. Would consider oncology referral and planning for possible palliative care consult   Thank you for allowing [place] to participate in the care of Jonathan Books, DNP, AGNP-C Adult & Gerontological Nurse Practitioner [facility]    LOS: 0 days     03/15/2016, 12:15 PM

## 2016-03-15 NOTE — Sedation Documentation (Signed)
Paracentesis started at this time. Paracentesis drainage color is dark tea colored with red tint.

## 2016-03-15 NOTE — Sedation Documentation (Signed)
Total of 1100 mL drainage from paracentesis.

## 2016-03-15 NOTE — ED Notes (Signed)
Lactic Acid 6.1. EDP notified.

## 2016-03-15 NOTE — ED Notes (Signed)
Pt to [place] at this time with RN L. Quentin Cornwall.

## 2016-03-15 NOTE — Consult Note (Signed)
Wesson Holderbaum MRN: [id number] DOB/AGE: Mar 20, 1956 60 y.o. Primary Care Physician:DIXON,MARY BETH, PA-C Admit date: 03/15/2016 Chief Complaint:  Chief Complaint  Patient presents with  . Abnormal Lab  . Weakness   HPI: Pt is 60 year old male with past medical hx of Al Abuse who came to ER with c/o abnormal labs  HPI dates back to past few weeks when pt started feeling, weakness was generalized and progressive. Pt also c/o confusion. Pt later went to see her PCP and was noticed to have abnormal labs and pt ws sent to ER,. Pt c/o  black  stools with every bowel movement for the past 2--3 weeks. Pt /co  some intermittent hematochezia NO c/o seizures though does c/o falls NO c/o tongue bite NO c/o change in speech  Data review from the Epic shows "Says that he got extremely depressed. Says that for the past 3 weeks about all he has eaten is 3 granola bars and Ensure.  Says that he has no appetite. Says that he also has had some diarrhea. Says "I know I'm malnourished. Probably dehydrated too" Says that he is so weak that he has even fallen several times. Says that he has been staying in bed 12-15 hours a day."  Past Medical History:  Diagnosis Date  . Alcohol abuse   . Anxiety   . Family history of premature coronary artery disease   . Gout   . Hyperglycemia   . Hyperlipidemia   . Hypertension       Family History  Problem Relation Age of Onset  . Heart disease Mother   . Hyperlipidemia Mother   . Hypertension Father   . Stroke Father   . Hypertension Sister   . Heart disease Brother     MI at 47yo    Social History:  reports that he has never smoked. His smokeless tobacco use includes Snuff. He reports that he drinks alcohol. He reports that he does not use drugs.   Allergies:  Allergies  Allergen Reactions  . Celexa [Citalopram Hydrobromide]     "Moody", "Change in personality", "Agitated"  . Zoloft [Sertraline Hcl]     "Just didn't feel right" "confused,  couldn't think clearly"     (Not in a hospital admission)     [id number] from the symptoms mentioned above,there are no other symptoms referable to all systems reviewed.  . folic acid  1 mg Oral Daily  . heparin  5,000 Units Subcutaneous Q8H  . LORazepam  0-4 mg Intravenous Q6H   Followed by  . [START ON 03/17/2016] LORazepam  0-4 mg Intravenous Q12H  . multivitamin with minerals  1 tablet Oral Daily  . sodium chloride flush  3 mL Intravenous Q12H  . thiamine  100 mg Oral Daily   Or  . thiamine  100 mg Intravenous Daily       Physical Exam: Vital signs in last 24 hours: Temp:  [97.7 F (36.5 C)-98 F (36.7 C)] 97.9 F (36.6 C) (10/12 1310) Pulse Rate:  [88-90] 88 (10/12 1405) Resp:  [16-23] 20 (10/12 1405) BP: (90-103)/(63-69) 91/65 (10/12 1405) SpO2:  [91 %-100 %] 100 % (10/12 1405) Weight:  [175 lb (79.4 kg)] 175 lb (79.4 kg) (10/12 1029) Weight change:     Intake/Output from previous day: No intake/output data recorded. Total I/O In: 340 [I.V.:10; Blood:330] Out: -    Physical Exam: General- pt is awake,alert, oriented to time place and person Resp- No acute REsp distress, decreased at bases CVS- S1S2  regular in rate and rhythm GIT- BS+, distended EXT- 1+ LE Edema, NO Cyanosis CNS- CN 2-12 grossly intact. Moving all 4 extremities Psych- normal mood and affect    Lab Results: CBC  Recent Labs  03/14/16 0843 03/15/16 1041  WBC 23.1* 29.3*  HGB 6.2* 5.6*  HCT 19.1* 16.6*  PLT 339 315    BMET  Recent Labs  03/14/16 0843 03/15/16 1041  NA <115* 109*  K 5.3 5.4*  CL <80* 75*  CO2 19* 20*  GLUCOSE 84 89  BUN 15 17  CREATININE 0.92 0.87  CALCIUM 7.4* 7.4*    MICRO No results found for this or any previous visit (from the past 240 hour(s)).    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 Adrenal deficiency        Low Na        High K        Low Bicarb        Adrenal mass   9) ID -admitted with High WBC       On IV abx  Plan:   Will repeat BMet now and then Will ask for BMET q6 hrs to see the Sodium trend Will ask for urine Na and osmolarity to help decide etiology of Hyponatremia Will ask for AM cortisol.       Catrena Vari S 03/15/2016, 2:14 PM

## 2016-03-15 NOTE — H&P (Signed)
History and Physical    Jonathan Hodges [id number] DOB: May 26, 1956 DOA: 03/15/2016  PCP: Karis Juba, PA-C  Patient coming from: Home  Chief Complaint: Weakness, abnormal labs  HPI: Jonathan Hodges is a 60 y.o. male with medical history significant of chronic alcohol abuse, depression, hyperlipidemia, hypertension who initially followed up with his primary care provider one day prior to hospital admission for routine follow-up. Routine blood work was obtained at that time. On the day of hospital admission, patient was instructed to present to the hospital after being found to have a critical low sodium and low hemoglobin. On further questioning, patient reports increased weakness over the past several weeks prior to this hospital admission. In addition, patient also noted abdominal distention that started 2 weeks ago. Patient denies chest pain or shortness of breath. Patient denies lower extremity edema. Patient also also reports drinking a sixpack of beer on a daily basis for several years. Last alcohol intake was on the morning of hospital admission.   ED Course: In the emergency department, patient was noted to have a sodium of 109 with hemoglobin of 5.6. White blood count of 29.3 thousand. Patient also noted to have alkaline phosphatase of 322, AST of 570, ALT of 201. Total bilirubin noted to be 3.3. Two units of packed red blood cells were ordered and are currently being given. CT scan of the head chest and abdomen were obtained findings of widespread metastatic disease present in the lungs, liver, abdominal nodes, right adrenal gland, and spleen. Considerations for esophageal adenocarcinoma versus hepatocellular carcinoma was favored. Gastroenterology was consulted with recommendations to continue to transfuse as needed, and consideration for possible endoscopic evaluation if patient significantly improves. Hospitalist service consulted for consideration for admission  Review of Systems:    Review of Systems  Constitutional: Positive for malaise/fatigue. Negative for chills and fever.  HENT: Negative for hearing loss.   Eyes: Negative for photophobia and pain.  Respiratory: Negative for cough and shortness of breath.   Cardiovascular: Negative for chest pain, orthopnea and claudication.  Gastrointestinal: Negative for abdominal pain and constipation.       Abdominal distension  Genitourinary: Negative for hematuria and urgency.  Musculoskeletal: Negative for back pain, falls and joint pain.  Neurological: Negative for tremors, sensory change, seizures, loss of consciousness and headaches.  Psychiatric/Behavioral: Negative for hallucinations and memory loss.    Past Medical History:  Diagnosis Date  . Alcohol abuse   . Anxiety   . Family history of premature coronary artery disease   . Gout   . Hyperglycemia   . Hyperlipidemia   . Hypertension     Past Surgical History:  Procedure Laterality Date  . arm fracture       reports that he has never smoked. His smokeless tobacco use includes Snuff. He reports that he drinks alcohol. He reports that he does not use drugs.  Allergies  Allergen Reactions  . Celexa [Citalopram Hydrobromide]     "Moody", "Change in personality", "Agitated"  . Zoloft [Sertraline Hcl]     "Just didn't feel right" "confused, couldn't think clearly"    Family History  Problem Relation Age of Onset  . Heart disease Mother   . Hyperlipidemia Mother   . Hypertension Father   . Stroke Father   . Hypertension Sister   . Heart disease Brother     MI at 80yo    Prior to Admission medications   Medication Sig Start Date End Date Taking? Authorizing Provider  acetaminophen (TYLENOL) 325 MG  tablet Take 2 tablets (650 mg total) by mouth every 4 (four) hours as needed for headache or mild pain. Patient not taking: Reported on 03/14/2016 12/27/14   Erlene Quan, PA-C  allopurinol (ZYLOPRIM) 300 MG tablet TAKE 1 TABLET BY MOUTH  DAILY. Patient not taking: Reported on 03/14/2016 08/22/15   Orlena Sheldon, PA-C  amLODipine (NORVASC) 10 MG tablet Take 1 tablet (10 mg total) by mouth every evening. 08/22/15   Orlena Sheldon, PA-C  atenolol (TENORMIN) 50 MG tablet Take 1 tablet (50 mg total) by mouth daily. 08/22/15   Lonie Peak Dixon, PA-C  benazepril (LOTENSIN) 40 MG tablet Take 1 tablet (40 mg total) by mouth daily. 08/22/15   Lonie Peak Dixon, PA-C  buPROPion (WELLBUTRIN SR) 150 MG 12 hr tablet Take 1 daily for 5 days then take 1 twice daily. 03/14/16   Lonie Peak Dixon, PA-C  colchicine 0.6 MG tablet Take 1 tablet (0.6 mg total) by mouth 2 (two) times daily. Patient not taking: Reported on 03/14/2016 12/27/14   Brett Canales, PA-C  DULoxetine (CYMBALTA) 30 MG capsule TAKE 1 CAPSULE (30 MG TOTAL) BY MOUTH DAILY. 02/22/16   Lonie Peak Dixon, PA-C  DULoxetine (CYMBALTA) 60 MG capsule TAKE 1 CAPSULE (60 MG TOTAL) BY MOUTH DAILY. 02/22/16   Lonie Peak Dixon, PA-C  ibuprofen (ADVIL,MOTRIN) 400 MG tablet Take 1 tablet (400 mg total) by mouth 3 (three) times daily. Patient not taking: Reported on 03/14/2016 12/27/14   Erlene Quan, PA-C  sildenafil (VIAGRA) 100 MG tablet Take 1/2 to 1 tablet by mouth as needed for erectile dysfunction Patient not taking: Reported on 03/14/2016 06/10/14   Orlena Sheldon, PA-C  simvastatin (ZOCOR) 10 MG tablet TAKE 1 TABLET (10 MG TOTAL) BY MOUTH AT BEDTIME. Patient not taking: Reported on 03/14/2016 06/10/14   Orlena Sheldon, PA-C    Physical Exam: Vitals:   03/15/16 1503 03/15/16 1525 03/15/16 1530 03/15/16 1535  BP: 106/68 111/65 109/73   Pulse: 90 90 92 93  Resp: 16 23 23 22   Temp:      TempSrc:      SpO2: 99% 98% 99% 99%  Weight:      Height:        Constitutional: NAD, calm, comfortable Vitals:   03/15/16 1503 03/15/16 1525 03/15/16 1530 03/15/16 1535  BP: 106/68 111/65 109/73   Pulse: 90 90 92 93  Resp: 16 23 23 22   Temp:      TempSrc:      SpO2: 99% 98% 99% 99%  Weight:      Height:       Eyes: PERRL,  Sclera appears icteric ENMT: Mucous membranes are moist. Posterior pharynx clear of any exudate or lesions.Normal dentition.  Neck: normal, supple, no masses, no thyromegaly Respiratory: clear to auscultation bilaterally, no wheezing, no crackles. Normal respiratory effort. No accessory muscle use.  Cardiovascular: Regular rate and rhythm Abdomen: Positive bowel sounds, midline mass palpable, ascitic, distended Musculoskeletal: no clubbing / cyanosis. No joint deformity upper and lower extremities. Good ROM, no contractures. Normal muscle tone.  Skin: no rashes, lesions, ulcers. No induration Neurologic: CN 2-12 grossly intact. Sensation intact, DTR normal. Strength 5/5 in all 4.  Psychiatric: Normal judgment and insight. Alert and oriented x 3. Normal mood.    Labs on Admission: I have personally reviewed following labs and imaging studies  CBC:  Recent Labs Lab 03/14/16 0843 03/15/16 1041  WBC 23.1* 29.3*  NEUTROABS 20,328* 25.4*  HGB 6.2* 5.6*  HCT 19.1* 16.6*  MCV 98.5 96.5  PLT 339 123456   Basic Metabolic Panel:  Recent Labs Lab 03/14/16 0843 03/15/16 1041  NA <115* 109*  K 5.3 5.4*  CL <80* 75*  CO2 19* 20*  GLUCOSE 84 89  BUN 15 17  CREATININE 0.92 0.87  CALCIUM 7.4* 7.4*   GFR: Estimated Creatinine Clearance: 93.2 mL/min (by C-G formula based on SCr of 0.87 mg/dL). Liver Function Tests:  Recent Labs Lab 03/14/16 0843 03/15/16 1041  AST 511* 570*  ALT 169* 201*  ALKPHOS 373* 322*  BILITOT 2.5* 3.3*  PROT 5.8* 6.1*  ALBUMIN 2.0* 1.8*   No results for input(s): LIPASE, AMYLASE in the last 168 hours.  Recent Labs Lab 03/15/16 1041  AMMONIA 22   Coagulation Profile:  Recent Labs Lab 03/15/16 1041  INR 1.43   Cardiac Enzymes: No results for input(s): CKTOTAL, CKMB, CKMBINDEX, TROPONINI in the last 168 hours. BNP (last 3 results) No results for input(s): PROBNP in the last 8760 hours. HbA1C: No results for input(s): HGBA1C in the last 72  hours. CBG: No results for input(s): GLUCAP in the last 168 hours. Lipid Profile: No results for input(s): CHOL, HDL, LDLCALC, TRIG, CHOLHDL, LDLDIRECT in the last 72 hours. Thyroid Function Tests:  Recent Labs  03/14/16 0843  TSH 3.61   Anemia Panel:  Recent Labs  03/14/16 0843  VITAMINB12 >2000*  FOLATE 6.3  FERRITIN 2,563*  TIBC 194*  IRON 35*  RETICCTPCT 9.9   Urine analysis: No results found for: COLORURINE, APPEARANCEUR, LABSPEC, PHURINE, GLUCOSEU, HGBUR, BILIRUBINUR, KETONESUR, PROTEINUR, UROBILINOGEN, NITRITE, LEUKOCYTESUR Sepsis Labs: !!!!!!!!!!!!!!!!!!!!!!!!!!!!!!!!!!!!!!!!!!!! @LABRCNTIP (procalcitonin:4,lacticidven:4) )No results found for this or any previous visit (from the past 240 hour(s)).   Radiological Exams on Admission:  Below imaging was personally reviewed by myself Dg Chest 2 View  Result Date: 03/15/2016 CLINICAL DATA:  Weakness for 10 days. EXAM: CHEST  2 VIEW COMPARISON:  Radiographs of December 26, 2014. FINDINGS: The heart size and mediastinal contours are within normal limits. Atherosclerosis of thoracic aorta is noted. No pneumothorax or pleural effusion is noted. Rounded density is seen in left midlung, with another rounded density seen in right lower lobe ; these are concerning for metastatic lesions. Smaller nodule is noted in right upper lobe. The visualized skeletal structures are unremarkable. IMPRESSION: Bilateral pulmonary nodules are noted concerning for metastatic disease. CT scan of the chest is recommended for further evaluation. Aortic atherosclerosis. Electronically Signed   By: Marijo Conception, M.D.   On: 03/15/2016 11:14   Ct Head Wo Contrast  Result Date: 03/15/2016 CLINICAL DATA:  60 year old male with generalized weakness, confusion and difficulty walking for 2 weeks. EXAM: CT HEAD WITHOUT CONTRAST TECHNIQUE: Contiguous axial images were obtained from the base of the skull through the vertex without intravenous contrast.  COMPARISON:  None. FINDINGS: Brain: No evidence of acute infarction, hemorrhage, hydrocephalus, extra-axial collection or mass lesion/mass effect. Mild atrophy and chronic small-vessel white matter ischemic changes noted. Vascular: No hyperdense vessel or unexpected calcification. Skull: Normal. Negative for fracture or focal lesion. Sinuses/Orbits: No acute finding. Other: None. IMPRESSION: No evidence of acute intracranial abnormality. Mild atrophy and chronic small-vessel white matter ischemic changes. Electronically Signed   By: Margarette Canada M.D.   On: 03/15/2016 12:27   Ct Chest W Contrast  Result Date: 03/15/2016 CLINICAL DATA:  Anemia and hyponatremia. EXAM: CT CHEST, ABDOMEN, AND PELVIS WITH CONTRAST TECHNIQUE: Multidetector CT imaging of the chest, abdomen and pelvis was performed following the standard protocol during  bolus administration of intravenous contrast. CONTRAST:  128mL ISOVUE-300 IOPAMIDOL (ISOVUE-300) INJECTION 61% COMPARISON:  None. FINDINGS: CT CHEST FINDINGS Cardiovascular: No cardiomegaly or pericardial effusion. Diffuse atherosclerotic calcification, including the coronary arteries. Mediastinum/Nodes: Diffuse thickening of the esophagus with luminal irregularity. The thickening does not enhance typical of varices. Surprising absence of mediastinal adenopathy. Lungs/Pleura: Innumerable lobulated discrete nodules consistent with metastatic disease, measuring up to 27 mm at the right base. No effusion or pneumothorax. No superimposed pneumonia or edema. Musculoskeletal: No acute or aggressive finding CT ABDOMEN PELVIS FINDINGS Hepatobiliary: Cirrhotic liver morphology with recannulized umbilical vein and innumerable bulky masses. A segment 4B mass extends through the capsule. No evidence of biliary obstruction or stone. Pancreas: Unremarkable. Spleen: Lobulated mass in the anterior spleen measuring up to 4 cm, presumed metastasis. There are at least 2 other smaller hypo enhancing areas.  Adrenals/Urinary Tract: 22 by 16 mm right adrenal mass consistent with metastasis in this setting. No hydronephrosis or stone. Unremarkable bladder. Stomach/Bowel:  No obstruction or mass. No inflammatory findings. Vascular/Lymphatic: Portal hypertension with recannulized periumbilical vein. Adenopathy in the upper abdominal ligaments and pre/periaortic locations consistent with metastases. Reproductive:No pathologic findings. Other: Moderate ascites without generalized peritoneal nodularity. Musculoskeletal: No acute or aggressive finding. IMPRESSION: 1. Widespread metastatic disease present in the lungs, liver, abdominal nodes, right adrenal gland, and spleen. Esophageal adenocarcinoma or hepatocellular carcinoma (background cirrhosis) is the favored primary. 2. Moderate ascites. One of the large left liver masses has extracapsular growth. Electronically Signed   By: Monte Fantasia M.D.   On: 03/15/2016 12:31   Ct Abdomen Pelvis W Contrast  Result Date: 03/15/2016 CLINICAL DATA:  Anemia and hyponatremia. EXAM: CT CHEST, ABDOMEN, AND PELVIS WITH CONTRAST TECHNIQUE: Multidetector CT imaging of the chest, abdomen and pelvis was performed following the standard protocol during bolus administration of intravenous contrast. CONTRAST:  178mL ISOVUE-300 IOPAMIDOL (ISOVUE-300) INJECTION 61% COMPARISON:  None. FINDINGS: CT CHEST FINDINGS Cardiovascular: No cardiomegaly or pericardial effusion. Diffuse atherosclerotic calcification, including the coronary arteries. Mediastinum/Nodes: Diffuse thickening of the esophagus with luminal irregularity. The thickening does not enhance typical of varices. Surprising absence of mediastinal adenopathy. Lungs/Pleura: Innumerable lobulated discrete nodules consistent with metastatic disease, measuring up to 27 mm at the right base. No effusion or pneumothorax. No superimposed pneumonia or edema. Musculoskeletal: No acute or aggressive finding CT ABDOMEN PELVIS FINDINGS  Hepatobiliary: Cirrhotic liver morphology with recannulized umbilical vein and innumerable bulky masses. A segment 4B mass extends through the capsule. No evidence of biliary obstruction or stone. Pancreas: Unremarkable. Spleen: Lobulated mass in the anterior spleen measuring up to 4 cm, presumed metastasis. There are at least 2 other smaller hypo enhancing areas. Adrenals/Urinary Tract: 22 by 16 mm right adrenal mass consistent with metastasis in this setting. No hydronephrosis or stone. Unremarkable bladder. Stomach/Bowel:  No obstruction or mass. No inflammatory findings. Vascular/Lymphatic: Portal hypertension with recannulized periumbilical vein. Adenopathy in the upper abdominal ligaments and pre/periaortic locations consistent with metastases. Reproductive:No pathologic findings. Other: Moderate ascites without generalized peritoneal nodularity. Musculoskeletal: No acute or aggressive finding. IMPRESSION: 1. Widespread metastatic disease present in the lungs, liver, abdominal nodes, right adrenal gland, and spleen. Esophageal adenocarcinoma or hepatocellular carcinoma (background cirrhosis) is the favored primary. 2. Moderate ascites. One of the large left liver masses has extracapsular growth. Electronically Signed   By: Monte Fantasia M.D.   On: 03/15/2016 12:31   US Paracentesis  Result Date: 03/15/2016 INDICATION: Malignant ascites. EXAM: ULTRASOUND GUIDED THERAPEUTIC AND DIAGNOSTIC PARACENTESIS MEDICATIONS: None. COMPLICATIONS: None immediate. PROCEDURE: Exam was  described to the patient who was subsequently treated with Ativan for signs of withdrawal. Before the procedure the patient had good understanding of the risks and benefits of the procedure and written informed consent was obtained from the patient. A timeout was performed prior to the initiation of the procedure. Initial ultrasound scanning demonstrates an adequate amount of ascites within the right lower abdominal quadrant. The right  lower abdomen was prepped and draped in the usual sterile fashion. 1% lidocaine was used for local anesthesia. Following this, a 19 gauge, 7-cm, Yueh catheter was introduced. An ultrasound image was saved for documentation purposes. The paracentesis was performed. The catheter was removed and a dressing was applied. The patient tolerated the procedure well without immediate post procedural complication. FINDINGS: A total of approximately 1,100 of blood-tinged and cloudy fluid was removed. Samples were sent to the laboratory and pathology as requested by the clinical team. IMPRESSION: Successful ultrasound-guided paracentesis yielding 1.1 liters of peritoneal fluid. Electronically Signed   By: Monte Fantasia M.D.   On: 03/15/2016 15:27    EKG: Independently reviewed. Normal sinus rhythm  Assessment/Plan Principal Problem:   Hyponatremia Active Problems:   Hyperlipidemia   Hypertension   GAD (generalized anxiety disorder)   Alcohol abuse, daily use   Alcoholic cirrhosis of liver with ascites (HCC)   Malignant neoplasm of liver (HCC)   Acute blood loss anemia   Metastatic malignant neoplasm ([place])  1. Widespread metastatic disease 1. These are new findings 2. CT scan personally reviewed by myself. Concerns for widespread metastatic disease 3. Uncertain primary, however radiographically, concerns for hepatocellular carcinoma versus esophageal cancer. 4. Patient does have moderate ascites on imaging. Will attempt therapeutic and diagnostic paracentesis. Follow-up on cytology 2. Hyponatremia 1. Patient presents with severely low sodium of 109 2. Given patient's medical complexity and severity of hyponatremia, will consult nephrology for assistance with sodium management. 3. Plan to repeat basic metabolic panel in the morning 4. Patient is currently alert and oriented and appears symptomatic, suggesting chronic hyponatremia 3. Ascites 1. Moderate ascites identified on presenting abdominal  CT 2. Discussed case with radiology. 3. Will attempt ultrasound-guided paracentesis for both diagnostic and therapeutic purposes. 4. Hypertension 1. Blood pressure currently stable, borderline soft 2. Will hold off on home blood pressure medications for the time being 5. Hyperlipidemia 1. Patient had been on statin prior to admission 2. Outpatient records were reviewed. Patient reportedly had been noncompliant with his home medications. 3. Continue to hold off on statin for the time being 6. Alcoholic cirrhosis 1. LFT's elevated on presentation 2. Continue to avoid hepatotoxic medications 3. We'll repeat comprehensive metabolic panel in morning 7. Acute blood loss anemia 1. Presenting hemoglobin of 5 with history of lack stools, worrisome for upper GI bleeding 2. Case was discussed with gastroenterology. Recommendations for continued transfusions as needed 3. Possible endoscopy if patient's condition improves. 8. Anxiety/depression 1. Seems stable at this time 2. We'll hold off on SSRI for the time being given patient's acute illness 9. Continued Alcohol abuse 1. Patient reports drinking a sixpack of beer on a daily basis for the past several years at least 2. Last alcohol intake was on the morning of hospital admission 3. Agree with continuing patient on CIWA protocol  DVT prophylaxis: SCD's  Code Status: Full Family Communication: Pt in room  Disposition Plan: Uncertain at this time  Consults called: Nephrology, GI Admission status: Inpt, stepdown   Ringo Sherod, Orpah Melter MD [facility] Pager [phone]  If 7PM-7AM, please contact  night-coverage [email or web address] Password [facility]  03/15/2016, 3:43 PM

## 2016-03-15 NOTE — ED Triage Notes (Signed)
PT c/o increased general weakness with periods of confusion and difficulty with ambulation x10 days. PT states he saw his PCP yesterday and the office called him this am and told him to come to ED d/t low hemoglobin. PT states black stools as well for over a week.

## 2016-03-15 NOTE — Telephone Encounter (Signed)
[place] called from [facility] reporting critical value on Pt  Sodium level. PCP made aware

## 2016-03-15 NOTE — ED Provider Notes (Signed)
[facility] DEPT Provider Note   CSN: [id number] Arrival date & time: 03/15/16  1007  By signing my name below, I, Sonum Patel, attest that this documentation has been prepared under the direction and in the presence of Merrily Pew, MD. Electronically Signed: Sonum Patel, Education administrator. 03/15/16. 10:41 AM.  History   Chief Complaint Chief Complaint  Patient presents with  . Abnormal Lab  . Weakness    The history is provided by the patient. No language interpreter was used.     HPI Comments: Jonathan Hodges is a 60 y.o. male who presents to the Emergency Department today with abnormal lab work. Patient was seen by PCP yesterday and was advised to come to ED today due to low sodium and hemoglobin levels. Patient states he has experienced worsening generalized weakness and confusion for the past 2 weeks. He states he has had increased difficulty walking with his legs buckling. He also complains of associated abdominal distension, dark/black stools, and decreased appetite for the same period of time. He reports daily alcohol consumption, states he drinks 4-5 beers each day. He denies cough, SOB, chest pain, back pain. He denies a smoking history. He denies known history of CA or evaluation for this.  .   Past Medical History:  Diagnosis Date  . Alcohol abuse   . Anxiety   . Family history of premature coronary artery disease   . Gout   . Hyperglycemia   . Hyperlipidemia   . Hypertension     Patient Active Problem List   Diagnosis Date Noted  . Hyponatremia 03/15/2016  . Alcoholic cirrhosis of liver with ascites ([place])   . Malignant neoplasm of liver ([place])   . Acute blood loss anemia   . Upper GI bleed   . Metastatic malignant neoplasm ([place])   . Generalized abdominal pain   . Chest pain 12/26/2014  . Alcohol abuse   . Anxiety   . Family history of premature coronary artery disease   . Hyperlipidemia   . Hypertension   . Gout   . Hyperglycemia   . GAD (generalized anxiety  disorder)   . Alcohol abuse, daily use     Past Surgical History:  Procedure Laterality Date  . arm fracture         Home Medications    Prior to Admission medications   Medication Sig Start Date End Date Taking? Authorizing Provider  amLODipine (NORVASC) 10 MG tablet Take 1 tablet (10 mg total) by mouth every evening. 08/22/15  Yes Orlena Sheldon, PA-C  atenolol (TENORMIN) 50 MG tablet Take 1 tablet (50 mg total) by mouth daily. 08/22/15  Yes Mary B Dixon, PA-C  benazepril (LOTENSIN) 40 MG tablet Take 1 tablet (40 mg total) by mouth daily. 08/22/15  Yes Mary B Dixon, PA-C  DULoxetine (CYMBALTA) 30 MG capsule TAKE 1 CAPSULE (30 MG TOTAL) BY MOUTH DAILY. 02/22/16  Yes Mary B Dixon, PA-C  DULoxetine (CYMBALTA) 60 MG capsule TAKE 1 CAPSULE (60 MG TOTAL) BY MOUTH DAILY. 02/22/16  Yes Mary B Dixon, PA-C  naproxen sodium (ANAPROX) 220 MG tablet Take 220 mg by mouth daily as needed (pain).   Yes Historical Provider, MD  allopurinol (ZYLOPRIM) 300 MG tablet TAKE 1 TABLET BY MOUTH DAILY. Patient not taking: Reported on 03/14/2016 08/22/15   Orlena Sheldon, PA-C  buPROPion [facility] SR) 150 MG 12 hr tablet Take 1 daily for 5 days then take 1 twice daily. Patient not taking: Reported on 03/15/2016 03/14/16   Lonie Peak  Dixon, PA-C  sildenafil (VIAGRA) 100 MG tablet Take 1/2 to 1 tablet by mouth as needed for erectile dysfunction Patient not taking: Reported on 03/14/2016 06/10/14   Orlena Sheldon, PA-C    Family History Family History  Problem Relation Age of Onset  . Heart disease Mother   . Hyperlipidemia Mother   . Hypertension Father   . Stroke Father   . Hypertension Sister   . Heart disease Brother     MI at 40yo    Social History Social History  Substance Use Topics  . Smoking status: Never Smoker  . Smokeless tobacco: Current User    Types: Snuff  . Alcohol use Yes     Comment: 5 beers daily     Allergies   Celexa [citalopram hydrobromide] and Zoloft [sertraline hcl]   Review of  Systems Review of Systems  Constitutional: Positive for appetite change.  Respiratory: Negative for cough and shortness of breath.   Cardiovascular: Negative for chest pain.  Gastrointestinal: Positive for abdominal distention.       +dark/black stool  Musculoskeletal: Negative for back pain.  Neurological: Positive for weakness.  Psychiatric/Behavioral: Positive for confusion.  All other systems reviewed and are negative.    Physical Exam Updated Vital Signs BP 109/73   Pulse 93   Temp 97.9 F (36.6 C) (Oral)   Resp 22   Ht 5\' 10"  (1.778 m)   Wt 175 lb (79.4 kg)   SpO2 99%   BMI 25.11 kg/m   Physical Exam  Constitutional: He is oriented to person, place, and time. He appears well-developed and well-nourished.  HENT:  Head: Normocephalic and atraumatic.  Eyes: Scleral icterus is present.  Cardiovascular: Normal rate, regular rhythm and normal heart sounds.   Pulmonary/Chest: Effort normal and breath sounds normal. No respiratory distress. He has no wheezes. He has no rales.  Abdominal: He exhibits distension. He exhibits no mass. There is tenderness (diffuse). There is guarding. There is no rebound.  Abdominal distension with diffuse tenderness, no rebound, intermittent guarding, no mass, no CVA tenderness  Genitourinary:  Genitourinary Comments: Dried dark stool, no obvious bright red blood   Neurological: He is alert and oriented to person, place, and time. He has normal strength. No cranial nerve deficit or sensory deficit.  Upper and lower motor function intact. Sensation intact  Skin: Skin is warm and dry.  Slight jaundice of the skin  Psychiatric: He has a normal mood and affect.  Nursing note and vitals reviewed.    ED Treatments / Results  DIAGNOSTIC STUDIES: Oxygen Saturation is 98% on RA, normal by my interpretation.    COORDINATION OF CARE: 10:41 AM Discussed treatment plan with pt at bedside and pt agreed to plan.    Labs (all labs ordered are  listed, but only abnormal results are displayed) Labs Reviewed  CBC WITH DIFFERENTIAL/PLATELET - Abnormal; Notable for the following:       Result Value   WBC 29.3 (*)    RBC 1.72 (*)    Hemoglobin 5.6 (*)    HCT 16.6 (*)    RDW 17.7 (*)    Neutro Abs 25.4 (*)    Monocytes Absolute 2.1 (*)    All other components within normal limits  COMPREHENSIVE METABOLIC PANEL - Abnormal; Notable for the following:    Sodium 109 (*)    Potassium 5.4 (*)    Chloride 75 (*)    CO2 20 (*)    Calcium 7.4 (*)    Total  Protein 6.1 (*)    Albumin 1.8 (*)    AST 570 (*)    ALT 201 (*)    Alkaline Phosphatase 322 (*)    Total Bilirubin 3.3 (*)    All other components within normal limits  LACTIC ACID, PLASMA - Abnormal; Notable for the following:    Lactic Acid, Venous 6.1 (*)    All other components within normal limits  PROTIME-INR - Abnormal; Notable for the following:    Prothrombin Time 17.6 (*)    All other components within normal limits  ACETAMINOPHEN LEVEL - Abnormal; Notable for the following:    Acetaminophen (Tylenol), Serum <10 (*)    All other components within normal limits  POC OCCULT BLOOD, ED - Abnormal; Notable for the following:    Fecal Occult Bld POSITIVE (*)    All other components within normal limits  BODY FLUID CULTURE  AEROBIC CULTURE (SUPERFICIAL SPECIMEN)  ANAEROBIC CULTURE  MRSA PCR SCREENING  AMMONIA  LACTIC ACID, PLASMA  CEA  CANCER ANTIGEN 19-9  BODY FLUID CELL COUNT WITH DIFFERENTIAL  PROTEIN, BODY FLUID  ALBUMIN, FLUID  CREATININE, URINE, RANDOM  OSMOLALITY, URINE  SODIUM, URINE, RANDOM  OSMOLALITY  BASIC METABOLIC PANEL  BASIC METABOLIC PANEL  BASIC METABOLIC PANEL  CORTISOL-AM, BLOOD  TYPE AND SCREEN  PREPARE RBC (CROSSMATCH)  ABO/RH  CYTOLOGY - NON PAP    EKG  EKG Interpretation None       Radiology Dg Chest 2 View  Result Date: 03/15/2016 CLINICAL DATA:  Weakness for 10 days. EXAM: CHEST  2 VIEW COMPARISON:  Radiographs of  December 26, 2014. FINDINGS: The heart size and mediastinal contours are within normal limits. Atherosclerosis of thoracic aorta is noted. No pneumothorax or pleural effusion is noted. Rounded density is seen in left midlung, with another rounded density seen in right lower lobe ; these are concerning for metastatic lesions. Smaller nodule is noted in right upper lobe. The visualized skeletal structures are unremarkable. IMPRESSION: Bilateral pulmonary nodules are noted concerning for metastatic disease. CT scan of the chest is recommended for further evaluation. Aortic atherosclerosis. Electronically Signed   By: Marijo Conception, M.D.   On: 03/15/2016 11:14   Ct Head Wo Contrast  Result Date: 03/15/2016 CLINICAL DATA:  60 year old male with generalized weakness, confusion and difficulty walking for 2 weeks. EXAM: CT HEAD WITHOUT CONTRAST TECHNIQUE: Contiguous axial images were obtained from the base of the skull through the vertex without intravenous contrast. COMPARISON:  None. FINDINGS: Brain: No evidence of acute infarction, hemorrhage, hydrocephalus, extra-axial collection or mass lesion/mass effect. Mild atrophy and chronic small-vessel white matter ischemic changes noted. Vascular: No hyperdense vessel or unexpected calcification. Skull: Normal. Negative for fracture or focal lesion. Sinuses/Orbits: No acute finding. Other: None. IMPRESSION: No evidence of acute intracranial abnormality. Mild atrophy and chronic small-vessel white matter ischemic changes. Electronically Signed   By: Margarette Canada M.D.   On: 03/15/2016 12:27   Ct Chest W Contrast  Result Date: 03/15/2016 CLINICAL DATA:  Anemia and hyponatremia. EXAM: CT CHEST, ABDOMEN, AND PELVIS WITH CONTRAST TECHNIQUE: Multidetector CT imaging of the chest, abdomen and pelvis was performed following the standard protocol during bolus administration of intravenous contrast. CONTRAST:  171mL ISOVUE-300 IOPAMIDOL (ISOVUE-300) INJECTION 61% COMPARISON:   None. FINDINGS: CT CHEST FINDINGS Cardiovascular: No cardiomegaly or pericardial effusion. Diffuse atherosclerotic calcification, including the coronary arteries. Mediastinum/Nodes: Diffuse thickening of the esophagus with luminal irregularity. The thickening does not enhance typical of varices. Surprising absence of mediastinal adenopathy. Lungs/Pleura:  Innumerable lobulated discrete nodules consistent with metastatic disease, measuring up to 27 mm at the right base. No effusion or pneumothorax. No superimposed pneumonia or edema. Musculoskeletal: No acute or aggressive finding CT ABDOMEN PELVIS FINDINGS Hepatobiliary: Cirrhotic liver morphology with recannulized umbilical vein and innumerable bulky masses. A segment 4B mass extends through the capsule. No evidence of biliary obstruction or stone. Pancreas: Unremarkable. Spleen: Lobulated mass in the anterior spleen measuring up to 4 cm, presumed metastasis. There are at least 2 other smaller hypo enhancing areas. Adrenals/Urinary Tract: 22 by 16 mm right adrenal mass consistent with metastasis in this setting. No hydronephrosis or stone. Unremarkable bladder. Stomach/Bowel:  No obstruction or mass. No inflammatory findings. Vascular/Lymphatic: Portal hypertension with recannulized periumbilical vein. Adenopathy in the upper abdominal ligaments and pre/periaortic locations consistent with metastases. Reproductive:No pathologic findings. Other: Moderate ascites without generalized peritoneal nodularity. Musculoskeletal: No acute or aggressive finding. IMPRESSION: 1. Widespread metastatic disease present in the lungs, liver, abdominal nodes, right adrenal gland, and spleen. Esophageal adenocarcinoma or hepatocellular carcinoma (background cirrhosis) is the favored primary. 2. Moderate ascites. One of the large left liver masses has extracapsular growth. Electronically Signed   By: Monte Fantasia M.D.   On: 03/15/2016 12:31   Ct Abdomen Pelvis W Contrast  Result  Date: 03/15/2016 CLINICAL DATA:  Anemia and hyponatremia. EXAM: CT CHEST, ABDOMEN, AND PELVIS WITH CONTRAST TECHNIQUE: Multidetector CT imaging of the chest, abdomen and pelvis was performed following the standard protocol during bolus administration of intravenous contrast. CONTRAST:  160mL ISOVUE-300 IOPAMIDOL (ISOVUE-300) INJECTION 61% COMPARISON:  None. FINDINGS: CT CHEST FINDINGS Cardiovascular: No cardiomegaly or pericardial effusion. Diffuse atherosclerotic calcification, including the coronary arteries. Mediastinum/Nodes: Diffuse thickening of the esophagus with luminal irregularity. The thickening does not enhance typical of varices. Surprising absence of mediastinal adenopathy. Lungs/Pleura: Innumerable lobulated discrete nodules consistent with metastatic disease, measuring up to 27 mm at the right base. No effusion or pneumothorax. No superimposed pneumonia or edema. Musculoskeletal: No acute or aggressive finding CT ABDOMEN PELVIS FINDINGS Hepatobiliary: Cirrhotic liver morphology with recannulized umbilical vein and innumerable bulky masses. A segment 4B mass extends through the capsule. No evidence of biliary obstruction or stone. Pancreas: Unremarkable. Spleen: Lobulated mass in the anterior spleen measuring up to 4 cm, presumed metastasis. There are at least 2 other smaller hypo enhancing areas. Adrenals/Urinary Tract: 22 by 16 mm right adrenal mass consistent with metastasis in this setting. No hydronephrosis or stone. Unremarkable bladder. Stomach/Bowel:  No obstruction or mass. No inflammatory findings. Vascular/Lymphatic: Portal hypertension with recannulized periumbilical vein. Adenopathy in the upper abdominal ligaments and pre/periaortic locations consistent with metastases. Reproductive:No pathologic findings. Other: Moderate ascites without generalized peritoneal nodularity. Musculoskeletal: No acute or aggressive finding. IMPRESSION: 1. Widespread metastatic disease present in the lungs,  liver, abdominal nodes, right adrenal gland, and spleen. Esophageal adenocarcinoma or hepatocellular carcinoma (background cirrhosis) is the favored primary. 2. Moderate ascites. One of the large left liver masses has extracapsular growth. Electronically Signed   By: Monte Fantasia M.D.   On: 03/15/2016 12:31   US Paracentesis  Result Date: 03/15/2016 INDICATION: Malignant ascites. EXAM: ULTRASOUND GUIDED THERAPEUTIC AND DIAGNOSTIC PARACENTESIS MEDICATIONS: None. COMPLICATIONS: None immediate. PROCEDURE: Exam was described to the patient who was subsequently treated with Ativan for signs of withdrawal. Before the procedure the patient had good understanding of the risks and benefits of the procedure and written informed consent was obtained from the patient. A timeout was performed prior to the initiation of the procedure. Initial ultrasound scanning demonstrates an adequate  amount of ascites within the right lower abdominal quadrant. The right lower abdomen was prepped and draped in the usual sterile fashion. 1% lidocaine was used for local anesthesia. Following this, a 19 gauge, 7-cm, Yueh catheter was introduced. An ultrasound image was saved for documentation purposes. The paracentesis was performed. The catheter was removed and a dressing was applied. The patient tolerated the procedure well without immediate post procedural complication. FINDINGS: A total of approximately 1,100 of blood-tinged and cloudy fluid was removed. Samples were sent to the laboratory and pathology as requested by the clinical team. IMPRESSION: Successful ultrasound-guided paracentesis yielding 1.1 liters of peritoneal fluid. Electronically Signed   By: Monte Fantasia M.D.   On: 03/15/2016 15:27    Procedures Procedures (including critical care time)  CRITICAL CARE Performed by: Merrily Pew Total critical care time: 65 minutes Critical care time was exclusive of separately billable procedures and treating other  patients. Critical care was necessary to treat or prevent imminent or life-threatening deterioration. Critical care was time spent personally by me on the following activities: development of treatment plan with patient and/or surrogate as well as nursing, discussions with consultants, evaluation of patient's response to treatment, examination of patient, obtaining history from patient or surrogate, ordering and performing treatments and interventions, ordering and review of laboratory studies, ordering and review of radiographic studies, pulse oximetry and re-evaluation of patient's condition.   Medications Ordered in ED Medications  iopamidol (ISOVUE-370) 76 % injection 100 mL (not administered)  LORazepam (ATIVAN) tablet 1 mg (not administered)    Or  LORazepam (ATIVAN) injection 1 mg (not administered)  thiamine (VITAMIN B-1) tablet 100 mg (not administered)    Or  thiamine (B-1) injection 100 mg (not administered)  folic acid (FOLVITE) tablet 1 mg (not administered)  multivitamin with minerals tablet 1 tablet (not administered)  LORazepam (ATIVAN) injection 0-4 mg (1 mg Intravenous Given 03/15/16 1404)    Followed by  LORazepam (ATIVAN) injection 0-4 mg (not administered)  sodium chloride flush (NS) 0.9 % injection 3 mL (not administered)  0.9 %  sodium chloride infusion ( Intravenous New Bag/Given 03/15/16 1243)  0.9 %  sodium chloride infusion ( Intravenous New Bag/Given 03/15/16 1256)  iopamidol (ISOVUE-300) 61 % injection 100 mL (100 mLs Intravenous Contrast Given 03/15/16 1136)  ceFEPIme (MAXIPIME) 2 g in dextrose 5 % 50 mL IVPB (0 g Intravenous Stopped 03/15/16 1236)  sodium chloride 0.9 % bolus 1,000 mL (0 mLs Intravenous Stopped 03/15/16 1238)  vancomycin (VANCOCIN) 1,500 mg in sodium chloride 0.9 % 500 mL IVPB (0 mg Intravenous Stopped 03/15/16 1532)  calcium gluconate 1 g in sodium chloride 0.9 % 100 mL IVPB (0 g Intravenous Stopped 03/15/16 1229)  sodium chloride 0.9 %  bolus 1,000 mL (0 mLs Intravenous Stopped 03/15/16 1532)     Initial Impression / Assessment and Plan / ED Course  I have reviewed the triage vital signs and the nursing notes.  Pertinent labs & imaging results that were available during my care of the patient were reviewed by me and considered in my medical decision making (see chart for details).  Clinical Course    Patient with metastatic cancer of unknown origin possibly about a cellular. Also with symptomatic hyponatremia of 109 started fluid infusions for that. Has anemia 5.2 since started 2 units of blood transfusion for that. Lactic acid is elevated at 6 with is likely secondary to his liver failure. Has a white blood cell count over 20 so we will also start empiric antibiotics  in case he has some infectious cause. Hyperkalemia, so calcium given for cardiac protection. Discussed with the patient the grave diagnosis that he faces however he does not have family in the area. GI consult it secondary to anemia and GI bleed and they will see the patient in the hospital.  GI saw patient and will scope at some point, but not right now 2/2 multiple critical lab abnormalities.    Final Clinical Impressions(s) / ED Diagnoses   Final diagnoses:  Hyponatremia  Weakness  Symptomatic anemia  Dehydration  Metastatic cancer (HCC)  Lactic acidemia  Acidosis  Hyperkalemia    New Prescriptions Current Discharge Medication List     I personally performed the services described in this documentation, which was scribed in my presence. The recorded information has been reviewed and is accurate.    Merrily Pew, MD 03/15/16 [phone]

## 2016-03-15 NOTE — ED Notes (Signed)
CRITICAL VALUE ALERT  Critical value received:  Hgb 5.6  Date of notification:  03/15/16  Time of notification:  [id number]  Critical value read back:Yes.    Nurse who received alert:  Laurell Josephs RN  MD notified (1st page):  Mesner  Time of first page:  1125  MD notified (2nd page):  Time of second page:  Responding MD:  Mesner  Time MD responded:  1125

## 2016-03-16 ENCOUNTER — Other Ambulatory Visit: Payer: Self-pay | Admitting: Physician Assistant

## 2016-03-16 DIAGNOSIS — R531 Weakness: Secondary | ICD-10-CM

## 2016-03-16 DIAGNOSIS — E872 Acidosis, unspecified: Secondary | ICD-10-CM

## 2016-03-16 DIAGNOSIS — F411 Generalized anxiety disorder: Secondary | ICD-10-CM

## 2016-03-16 DIAGNOSIS — C220 Liver cell carcinoma: Secondary | ICD-10-CM

## 2016-03-16 DIAGNOSIS — R180 Malignant ascites: Secondary | ICD-10-CM

## 2016-03-16 DIAGNOSIS — F1010 Alcohol abuse, uncomplicated: Secondary | ICD-10-CM

## 2016-03-16 DIAGNOSIS — R188 Other ascites: Secondary | ICD-10-CM

## 2016-03-16 DIAGNOSIS — D649 Anemia, unspecified: Secondary | ICD-10-CM

## 2016-03-16 LAB — BASIC METABOLIC PANEL
Anion gap: 9 (ref 5–15)
Anion gap: 9 (ref 5–15)
Anion gap: 8 (ref 5–15)
BUN: 16 mg/dL (ref 6–20)
BUN: 15 mg/dL (ref 6–20)
BUN: 15 mg/dL (ref 6–20)
CO2: 20 mmol/L — ABNORMAL LOW (ref 22–32)
CO2: 21 mmol/L — ABNORMAL LOW (ref 22–32)
CO2: 22 mmol/L (ref 22–32)
Calcium: 7.1 mg/dL — ABNORMAL LOW (ref 8.9–10.3)
Calcium: 7 mg/dL — ABNORMAL LOW (ref 8.9–10.3)
Calcium: 7 mg/dL — ABNORMAL LOW (ref 8.9–10.3)
Chloride: 83 mmol/L — ABNORMAL LOW (ref 101–111)
Chloride: 84 mmol/L — ABNORMAL LOW (ref 101–111)
Chloride: 85 mmol/L — ABNORMAL LOW (ref 101–111)
Creatinine, Ser: 0.74 mg/dL (ref 0.61–1.24)
Creatinine, Ser: 0.7 mg/dL (ref 0.61–1.24)
Creatinine, Ser: 0.75 mg/dL (ref 0.61–1.24)
GFR calc Af Amer: >60 mL/min (ref >60)
GFR calc Af Amer: >60 mL/min (ref >60)
GFR calc Af Amer: >60 mL/min (ref >60)
GFR calc non Af Amer: >60 mL/min (ref >60)
GFR calc non Af Amer: >60 mL/min (ref >60)
GFR calc non Af Amer: >60 mL/min (ref >60)
Glucose, Bld: 79 mg/dL (ref 65–99)
Glucose, Bld: 75 mg/dL (ref 65–99)
Glucose, Bld: 72 mg/dL (ref 65–99)
Potassium: 4.5 mmol/L (ref 3.5–5.1)
Potassium: 4.7 mmol/L (ref 3.5–5.1)
Potassium: 4.5 mmol/L (ref 3.5–5.1)
Sodium: 112 mmol/L — CL (ref 135–145)
Sodium: 114 mmol/L — CL (ref 135–145)
Sodium: 115 mmol/L — CL (ref 135–145)

## 2016-03-16 LAB — COMPREHENSIVE METABOLIC PANEL
ALBUMIN: 1.7 g/dL — AB (ref 3.5–5.0)
ALT: 174 U/L — ABNORMAL HIGH (ref 17–63)
ANION GAP: 8 (ref 5–15)
AST: 493 U/L — ABNORMAL HIGH (ref 15–41)
Alkaline Phosphatase: 297 U/L — ABNORMAL HIGH (ref 38–126)
BILIRUBIN TOTAL: 3.9 mg/dL — AB (ref 0.3–1.2)
BUN: 15 mg/dL (ref 6–20)
CO2: 21 mmol/L — ABNORMAL LOW (ref 22–32)
Calcium: 7.1 mg/dL — ABNORMAL LOW (ref 8.9–10.3)
Chloride: 84 mmol/L — ABNORMAL LOW (ref 101–111)
Creatinine, Ser: 0.73 mg/dL (ref 0.61–1.24)
GFR calc Af Amer: >60 mL/min (ref >60)
GLUCOSE: 72 mg/dL (ref 65–99)
POTASSIUM: 4.6 mmol/L (ref 3.5–5.1)
Sodium: 113 mmol/L — CL (ref 135–145)
TOTAL PROTEIN: 5.6 g/dL — AB (ref 6.5–8.1)

## 2016-03-16 LAB — CBC
HEMATOCRIT: 20.6 % — AB (ref 39.0–52.0)
HEMOGLOBIN: 7.3 g/dL — AB (ref 13.0–17.0)
MCH: 32.3 pg (ref 26.0–34.0)
MCHC: 35.4 g/dL (ref 30.0–36.0)
MCV: 91.2 fL (ref 78.0–100.0)
Platelets: 203 10*3/uL (ref 150–400)
RBC: 2.26 MIL/uL — AB (ref 4.22–5.81)
RDW: 17.5 % — ABNORMAL HIGH (ref 11.5–15.5)
WBC: 23 10*3/uL — AB (ref 4.0–10.5)

## 2016-03-16 LAB — CEA: CEA: 58.8 ng/mL — ABNORMAL HIGH (ref 0.0–4.7)

## 2016-03-16 LAB — TYPE AND SCREEN
ABO/RH(D): O POS
ANTIBODY SCREEN: NEGATIVE
UNIT DIVISION: 0
Unit division: 0

## 2016-03-16 LAB — RAPID URINE DRUG SCREEN, HOSP PERFORMED
AMPHETAMINES: NOT DETECTED
Barbiturates: NOT DETECTED
Benzodiazepines: POSITIVE — AB
Cocaine: NOT DETECTED
OPIATES: NOT DETECTED
Tetrahydrocannabinol: NOT DETECTED

## 2016-03-16 LAB — URINALYSIS, ROUTINE W REFLEX MICROSCOPIC
BILIRUBIN URINE: NEGATIVE
Glucose, UA: NEGATIVE mg/dL
Hgb urine dipstick: NEGATIVE
Ketones, ur: 15 mg/dL — AB
Leukocytes, UA: NEGATIVE
NITRITE: NEGATIVE
PROTEIN: NEGATIVE mg/dL
SPECIFIC GRAVITY, URINE: 1.01 (ref 1.005–1.030)
pH: 6 (ref 5.0–8.0)

## 2016-03-16 LAB — LACTIC ACID, PLASMA: LACTIC ACID, VENOUS: 1.2 mmol/L (ref 0.5–1.9)

## 2016-03-16 LAB — AFP TUMOR MARKER: AFP TUMOR MARKER: 78.8 ng/mL — AB (ref 0.0–8.3)

## 2016-03-16 LAB — CORTISOL-AM, BLOOD: Cortisol - AM: 20.3 ug/dL (ref 6.7–22.6)

## 2016-03-16 LAB — CANCER ANTIGEN 19-9: CA 19-9: 200 U/mL — ABNORMAL HIGH (ref 0–35)

## 2016-03-16 MED ORDER — PRO-STAT SUGAR FREE PO LIQD
30.0000 mL | Freq: Four times a day (QID) | ORAL | Status: DC
  Administered 2016-03-16: 30 mL via ORAL
  Filled 2016-03-16: qty 30

## 2016-03-16 MED ORDER — DEXTROSE 5 % IV SOLN
2.0000 g | INTRAVENOUS | Status: DC
  Administered 2016-03-16 – 2016-03-18 (×3): 2 g via INTRAVENOUS
  Filled 2016-03-16 (×4): qty 2

## 2016-03-16 NOTE — Treatment Plan (Addendum)
Contacted patient's mother who is in [place]. Fully updated patient's mother about patient's complicated medical condition and poor overall prognosis. All questions were answered. Patient's mother plans to update rest of family and plan to travel to  to be with her son. In the meantime, patient's mother states patient's wishes are for DNR/DNI. Orders will be placed. Will continue supportive care for now.  Earlier in the day, had updated individual in room who falsely identified himself as patient's brother who later corrected himself as "like his brother."

## 2016-03-16 NOTE — Progress Notes (Signed)
Initial Nutrition Assessment  DOCUMENTATION CODES:  Severe malnutrition in context of acute illness/injury   Pt meets criteria for SEVERE MALNUTRITION in the context of acute illness as evidenced by intake of < or equal to 50% needs for > or equal to 5 days and moderate muscle/fat wasting .  INTERVENTION:   30 mL Prostat qid, each supplement provides 100 kcal and 15 grams of protein.  NUTRITION DIAGNOSIS:  Increased nutrient needs related to cancer and cancer related treatments as evidenced by estimated nutritional requirements for this condition  GOAL:  Patient will meet greater than or equal to 90% of their needs  MONITOR:  PO intake, Supplement acceptance, Diet advancement, Labs, Goals of Care  REASON FOR ASSESSMENT:  Malnutrition Screening Tool    ASSESSMENT:  60 y/o male PMHx etoh abuse, depression/anxiety, HLD, HTN. Presented to PCP 10/11 and was instructed to go to hospital due to hyponatremic and anemia. Pt additionally reported abdominal distention, and weakness over several weeks. CT revealed widespread metastatic disease. Admitted for possible Endoscopic evaluation  P/t s/p 1.1 L paracentesis  Pt is unable to communicate at this time, per progress notes, pt appears to be going through etoh withdrawal.   Per chart review, pt had reported yesterday that for the past 3 weeks he had only 3 granola bars and ensure. He also reported poor appetite, diarrhea, weakness and staying in bed 12-15 hrs per day.   Weight appears to have been stable for the last 7 months, though edema potentially masking sig weight loss.   At this time, pt has had no documented PO intake. Patient on tight fluid restriction. Will order prostat qid to reduce excess fluid and provide increased protein.   NFPE: Moderate orbital fat wasting. Mild wasting of temporal and clavicular musculature. Moderate wasting of deltoid/accromion musculature Abdomen-firm/ascitic. Mild peripheral edema  Labs:  albumin:  1.7, WBC: 23, CBGs in 70s, Severe hyponatremia, Hemoglobin 7.3 Medications: IV abx, folate, thiamin, Ensure BID, ativan, mvi with minerals,     Recent Labs Lab 03/15/16 2100 03/16/16 0324 03/16/16 0931  NA 111* 113* 112*  K 4.8 4.6 4.5  CL 82* 84* 83*  CO2 22 21* 20*  BUN 14 15 16   CREATININE 0.66 0.73 0.74  CALCIUM 6.8* 7.1* 7.1*  GLUCOSE 72 72 79   Diet Order:  Diet regular Room service appropriate? Yes; Fluid consistency: Thin; Fluid restriction: 1200 mL Fluid  Skin:  Reviewed, no issues  Last BM:  10/12  Height:  Ht Readings from Last 1 Encounters:  03/15/16 5\' 10"  (1.778 m)   Weight:  Wt Readings from Last 1 Encounters:  03/16/16 175 lb 1.4 oz (79.4 kg)   Wt Readings from Last 10 Encounters:  03/16/16 175 lb 1.4 oz (79.4 kg)  08/22/15 177 lb (80.3 kg)  12/27/14 160 lb 8 oz (72.8 kg)  06/10/14 176 lb (79.8 kg)  09/07/13 199 lb (90.3 kg)  05/25/13 206 lb (93.4 kg)  03/05/13 198 lb (89.8 kg)  12/18/12 210 lb (95.3 kg)  09/05/12 216 lb (98 kg)  08/15/12 212 lb (96.2 kg)   Ideal Body Weight:  75.45 kg  BMI:  Body mass index is 25.12 kg/m.  Estimated Nutritional Needs:  Kcal:  2300-2500 kcals (1.4-1.5 Michele Rockers) Protein:  106-121 g Pro (1.4-1.6 g/kg IBW) Fluid:  <1.2 Liters  EDUCATION NEEDS:  No education needs identified at this time  Burtis Junes RD, LDN, CNSC Clinical Nutrition Pager: [phone] 03/16/2016 11:40 AM

## 2016-03-16 NOTE — Progress Notes (Signed)
Subjective: Patient is sleeping in bed, unable to be awakened to voice/touch. Limited subjective exam.  Objective: Vital signs in last 24 hours: Temp:  [97.4 F (36.3 C)-98.8 F (37.1 C)] 97.4 F (36.3 C) (10/13 0802) Pulse Rate:  [86-127] 107 (10/13 0802) Resp:  [16-25] 22 (10/13 0802) BP: (85-116)/(49-75) 97/66 (10/13 0802) SpO2:  [90 %-100 %] 95 % (10/13 0802) Weight:  [175 lb (79.4 kg)-175 lb 1.4 oz (79.4 kg)] 175 lb 1.4 oz (79.4 kg) (10/13 0400) Last BM Date: 03/15/16 General:   Sleeping/snoring, does not awaken to voice/touch. Eyes:  Scleral icterus Neck:  Supple, without thyromegaly or masses.  Heart:  S1, S2 present, no murmurs noted.  Lungs: Clear to auscultation bilaterally, without wheezing, rales, or rhonchi.  Abdomen:  Bowel sounds present, form, non-tender, distended. No rebound or guarding. Midline abdominal mass noted again this morning.  Msk:  Symmetrical without gross deformities. Extremities:  Without clubbing or edema. Neurologic:  Sleeping and not awakening to voice/touch. Skin:  Warm and dry, intact without significant lesions.  Psych:  Alert and cooperative. Normal mood and affect.  Intake/Output from previous day: 10/12 0701 - 10/13 0700 In: 670 [I.V.:10; Blood:660] Out: 500 [Urine:500] Intake/Output this shift: No intake/output data recorded.  Lab Results:  Recent Labs  03/14/16 0843 03/15/16 1041 03/16/16 0324  WBC 23.1* 29.3* 23.0*  HGB 6.2* 5.6* 7.3*  HCT 19.1* 16.6* 20.6*  PLT 339 315 203   BMET  Recent Labs  03/15/16 1524 03/15/16 2100 03/16/16 0324  NA 110* 111* 113*  K 5.0 4.8 4.6  CL 80* 82* 84*  CO2 22 22 21*  GLUCOSE 78 72 72  BUN 15 14 15   CREATININE 0.67 0.66 0.73  CALCIUM 6.8* 6.8* 7.1*   LFT  Recent Labs  03/14/16 0843 03/15/16 1041 03/16/16 0324  PROT 5.8* 6.1* 5.6*  ALBUMIN 2.0* 1.8* 1.7*  AST 511* 570* 493*  ALT 169* 201* 174*  ALKPHOS 373* 322* 297*  BILITOT 2.5* 3.3* 3.9*   PT/INR  Recent  Labs  03/15/16 1041  LABPROT 17.6*  INR 1.43   Hepatitis Panel No results for input(s): HEPBSAG, HCVAB, HEPAIGM, HEPBIGM in the last 72 hours.   Studies/Results: Dg Chest 2 View  Result Date: 03/15/2016 CLINICAL DATA:  Weakness for 10 days. EXAM: CHEST  2 VIEW COMPARISON:  Radiographs of December 26, 2014. FINDINGS: The heart size and mediastinal contours are within normal limits. Atherosclerosis of thoracic aorta is noted. No pneumothorax or pleural effusion is noted. Rounded density is seen in left midlung, with another rounded density seen in right lower lobe ; these are concerning for metastatic lesions. Smaller nodule is noted in right upper lobe. The visualized skeletal structures are unremarkable. IMPRESSION: Bilateral pulmonary nodules are noted concerning for metastatic disease. CT scan of the chest is recommended for further evaluation. Aortic atherosclerosis. Electronically Signed   By: Marijo Conception, M.D.   On: 03/15/2016 11:14   Ct Head Wo Contrast  Result Date: 03/15/2016 CLINICAL DATA:  60 year old male with generalized weakness, confusion and difficulty walking for 2 weeks. EXAM: CT HEAD WITHOUT CONTRAST TECHNIQUE: Contiguous axial images were obtained from the base of the skull through the vertex without intravenous contrast. COMPARISON:  None. FINDINGS: Brain: No evidence of acute infarction, hemorrhage, hydrocephalus, extra-axial collection or mass lesion/mass effect. Mild atrophy and chronic small-vessel white matter ischemic changes noted. Vascular: No hyperdense vessel or unexpected calcification. Skull: Normal. Negative for fracture or focal lesion. Sinuses/Orbits: No acute finding. Other:  None. IMPRESSION: No evidence of acute intracranial abnormality. Mild atrophy and chronic small-vessel white matter ischemic changes. Electronically Signed   By: Margarette Canada M.D.   On: 03/15/2016 12:27   Ct Chest W Contrast  Result Date: 03/15/2016 CLINICAL DATA:  Anemia and  hyponatremia. EXAM: CT CHEST, ABDOMEN, AND PELVIS WITH CONTRAST TECHNIQUE: Multidetector CT imaging of the chest, abdomen and pelvis was performed following the standard protocol during bolus administration of intravenous contrast. CONTRAST:  144mL ISOVUE-300 IOPAMIDOL (ISOVUE-300) INJECTION 61% COMPARISON:  None. FINDINGS: CT CHEST FINDINGS Cardiovascular: No cardiomegaly or pericardial effusion. Diffuse atherosclerotic calcification, including the coronary arteries. Mediastinum/Nodes: Diffuse thickening of the esophagus with luminal irregularity. The thickening does not enhance typical of varices. Surprising absence of mediastinal adenopathy. Lungs/Pleura: Innumerable lobulated discrete nodules consistent with metastatic disease, measuring up to 27 mm at the right base. No effusion or pneumothorax. No superimposed pneumonia or edema. Musculoskeletal: No acute or aggressive finding CT ABDOMEN PELVIS FINDINGS Hepatobiliary: Cirrhotic liver morphology with recannulized umbilical vein and innumerable bulky masses. A segment 4B mass extends through the capsule. No evidence of biliary obstruction or stone. Pancreas: Unremarkable. Spleen: Lobulated mass in the anterior spleen measuring up to 4 cm, presumed metastasis. There are at least 2 other smaller hypo enhancing areas. Adrenals/Urinary Tract: 22 by 16 mm right adrenal mass consistent with metastasis in this setting. No hydronephrosis or stone. Unremarkable bladder. Stomach/Bowel:  No obstruction or mass. No inflammatory findings. Vascular/Lymphatic: Portal hypertension with recannulized periumbilical vein. Adenopathy in the upper abdominal ligaments and pre/periaortic locations consistent with metastases. Reproductive:No pathologic findings. Other: Moderate ascites without generalized peritoneal nodularity. Musculoskeletal: No acute or aggressive finding. IMPRESSION: 1. Widespread metastatic disease present in the lungs, liver, abdominal nodes, right adrenal gland,  and spleen. Esophageal adenocarcinoma or hepatocellular carcinoma (background cirrhosis) is the favored primary. 2. Moderate ascites. One of the large left liver masses has extracapsular growth. Electronically Signed   By: Monte Fantasia M.D.   On: 03/15/2016 12:31   Ct Abdomen Pelvis W Contrast  Result Date: 03/15/2016 CLINICAL DATA:  Anemia and hyponatremia. EXAM: CT CHEST, ABDOMEN, AND PELVIS WITH CONTRAST TECHNIQUE: Multidetector CT imaging of the chest, abdomen and pelvis was performed following the standard protocol during bolus administration of intravenous contrast. CONTRAST:  15mL ISOVUE-300 IOPAMIDOL (ISOVUE-300) INJECTION 61% COMPARISON:  None. FINDINGS: CT CHEST FINDINGS Cardiovascular: No cardiomegaly or pericardial effusion. Diffuse atherosclerotic calcification, including the coronary arteries. Mediastinum/Nodes: Diffuse thickening of the esophagus with luminal irregularity. The thickening does not enhance typical of varices. Surprising absence of mediastinal adenopathy. Lungs/Pleura: Innumerable lobulated discrete nodules consistent with metastatic disease, measuring up to 27 mm at the right base. No effusion or pneumothorax. No superimposed pneumonia or edema. Musculoskeletal: No acute or aggressive finding CT ABDOMEN PELVIS FINDINGS Hepatobiliary: Cirrhotic liver morphology with recannulized umbilical vein and innumerable bulky masses. A segment 4B mass extends through the capsule. No evidence of biliary obstruction or stone. Pancreas: Unremarkable. Spleen: Lobulated mass in the anterior spleen measuring up to 4 cm, presumed metastasis. There are at least 2 other smaller hypo enhancing areas. Adrenals/Urinary Tract: 22 by 16 mm right adrenal mass consistent with metastasis in this setting. No hydronephrosis or stone. Unremarkable bladder. Stomach/Bowel:  No obstruction or mass. No inflammatory findings. Vascular/Lymphatic: Portal hypertension with recannulized periumbilical vein. Adenopathy  in the upper abdominal ligaments and pre/periaortic locations consistent with metastases. Reproductive:No pathologic findings. Other: Moderate ascites without generalized peritoneal nodularity. Musculoskeletal: No acute or aggressive finding. IMPRESSION: 1. Widespread metastatic disease present in the  lungs, liver, abdominal nodes, right adrenal gland, and spleen. Esophageal adenocarcinoma or hepatocellular carcinoma (background cirrhosis) is the favored primary. 2. Moderate ascites. One of the large left liver masses has extracapsular growth. Electronically Signed   By: Monte Fantasia M.D.   On: 03/15/2016 12:31   US Paracentesis  Result Date: 03/15/2016 INDICATION: Malignant ascites. EXAM: ULTRASOUND GUIDED THERAPEUTIC AND DIAGNOSTIC PARACENTESIS MEDICATIONS: None. COMPLICATIONS: None immediate. PROCEDURE: Exam was described to the patient who was subsequently treated with Ativan for signs of withdrawal. Before the procedure the patient had good understanding of the risks and benefits of the procedure and written informed consent was obtained from the patient. A timeout was performed prior to the initiation of the procedure. Initial ultrasound scanning demonstrates an adequate amount of ascites within the right lower abdominal quadrant. The right lower abdomen was prepped and draped in the usual sterile fashion. 1% lidocaine was used for local anesthesia. Following this, a 19 gauge, 7-cm, Yueh catheter was introduced. An ultrasound image was saved for documentation purposes. The paracentesis was performed. The catheter was removed and a dressing was applied. The patient tolerated the procedure well without immediate post procedural complication. FINDINGS: A total of approximately 1,100 of blood-tinged and cloudy fluid was removed. Samples were sent to the laboratory and pathology as requested by the clinical team. IMPRESSION: Successful ultrasound-guided paracentesis yielding 1.1 liters of peritoneal fluid.  Electronically Signed   By: Monte Fantasia M.D.   On: 03/15/2016 15:27    Assessment: 60 year old male presented with 2-3 weeks of melena and acute decline in hgb, multiple electrolyte abnormalities including sodium of 109. LFTs all elevated, noted jaundice. Mass appreciated on physical exam. 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 CT ominous with "innumerable" liver masses and widespread metastasis. Primary likely esophageal versus liver (per radiology). Given his alcoholism and not definitive cirrhotic morphology, I feel he likely has had underlying cirrhosis, developed [place] which has now spread.  Would need mid to upper 120s AT A MINIMUM to consider procedure. His sodium has improved slightly with nephrology input (113 today).  LFT elevation and jaundice likely underlying cirrhosis with possible shock liver with blood loss and "soft" BP and/or compounded by liver cancer.  Labs today show proved sodium 113, stable chloride low at 84, persistent but ever slightly improved AST/ALT elevation at 493/174, continued alkaline phosphatase elevation at 297, bili worsening at 3.9 compared to 3.3 yesterday. CBC shows continued leukocytosis 23.0 with normal platelets and continued anemia at 7.3 status post transfusions.  Paracentesis performed yesterday which contained 1100 mL of blood-tinged and cloudy fluid was removed and sent to lab and pathology.. cell count shows his cloudy fluid, elevated blood cells 7015, elevated neutrophil count at 91%. Fluid protein normal, fluid albumin normal, fluid culture currently in process. Fluid Gram stain notes abundant white blood cells present predominately p.m. and without organisms seen.  CEA elevated at 58.8, cancer antigen 19-9 elevated at 200, AFP elevated at 78.8. Lactic acid today is improved to 1.2.  I had a discussion with the hospitalist today he states the patient appears to be going into alcohol withdrawal at this time. This is  consistent with his inability to be awoken by voice and touch. Palliative care not in the hospital this week. Unable to have intelligible conversation about expectations. He is attempting to contact in the family.  Overall prognosis grim.   Plan: 1. Continue to monitor for GI bleed 2. Monitor H/H 3. Transient uses necessary  4. Supportive measures 5. We will follow along with you.   Thank you for allowing [place] to participate in the care of Loleta Books, DNP, AGNP-C Adult & Gerontological Nurse Practitioner [facility]    LOS: 1 day    03/16/2016, 9:54 AM

## 2016-03-16 NOTE — Patient Care Conference (Signed)
Patient's daughter called today for update. At time of admission, patient stated to me his only family members are a sister and his mother, both of whom are out of state. Mother was updated earlier this AM. Daughter, from [place], has been made aware of patient's condition and overall prognosis. She is in full agreement with current plan of care. On further questioning, patient's daughter states that patient has a known history of polysubstance abuse, most notably cocaine and opiates. Will order UDS. Patient's daughter is currently en route to hospital to be with patient. Will follow. Ascitic cytology still pending at this time.

## 2016-03-16 NOTE — Progress Notes (Signed)
Pt's Sodium level is 114. MD is made aware.

## 2016-03-16 NOTE — Telephone Encounter (Signed)
Rx refilled per protocol 

## 2016-03-16 NOTE — Progress Notes (Signed)
PROGRESS NOTE    Jonathan Hodges  [id number] DOB: 17-Apr-1956 DOA: 03/15/2016 PCP: Karis Juba, PA-C    Brief Narrative:  60 y.o. male with medical history significant of chronic alcohol abuse, depression, hyperlipidemia, hypertension who initially followed up with his primary care provider one day prior to hospital admission for routine follow-up. Routine blood work was obtained at that time. On the day of hospital admission, patient was instructed to present to the hospital after being found to have a critical low sodium and low hemoglobin. On further questioning, patient reports increased weakness over the past several weeks prior to this hospital admission. In addition, patient also noted abdominal distention that started 2 weeks ago. Patient denies chest pain or shortness of breath. Patient denies lower extremity edema. Patient also also reports drinking a sixpack of beer on a daily basis for several years. Last alcohol intake was on the morning of hospital admission  Assessment & Plan:   Principal Problem:   Hyponatremia Active Problems:   Hyperlipidemia   Hypertension   GAD (generalized anxiety disorder)   Alcohol abuse, daily use   Alcoholic cirrhosis of liver with ascites (HCC)   Malignant neoplasm of liver (HCC)   Acute blood loss anemia   Metastatic cancer (HCC)   Ascites   Lactic acidemia   Malignant ascites   Symptomatic anemia   Weakness   1. Widespread metastatic disease of uncertain primary 1. CT scan reviewed, widespread metastatic disease noted 2. Patient is now status post ultrasound-guided paracentesis on 03/15/2016, cytology pending 3. Overall prognosis grim 4. Plan to discuss with oncology once cytology report is available 2. Hyponatremia 1. Presents with sodium of 109 2. Appreciate assistance by nephrology. Discussed case with nephrology this morning 3. Recommendation for continued fluid restriction, repeat urine sodium 4. We'll repeat hypertensive  metabolic panel in morning 3. Ascites, possible SBP versus malignant ascites 1. SBP is a new consideration for today 2. Patient is status post ultrasound-guided paracentesis 3. ANC calculated to over 6000 on fluid analysis 4. Discussed case with gastroenterology who recommends empiric Rocephin 5. await culture of ascitic fluid 6. Patient does have a presenting leukocytosis of around 25,000 4. Hypertension 1. Blood pressure borderline low this morning 2. Discussed case with nephrology who recommends fluid restriction 3. If blood pressure further decompensates, would consider IV fluid boluses at that time 5. Hyperlipidemia 1. Patient had been on statin prior to admission 2. Outpatient records were reviewed. Patient reportedly had been noncompliant with his home medications. 3. Continue to hold off on statin for the time being 6. Alcoholic cirrhosis 1. All liver function profiles noted to be elevated on initial presentation 2. 2:1 AST to ALT ratio noted 3. Likely multifactorial etiology for presenting liver disease which includes alcoholic hepatitis, shock liver, malignancy 4. Repeat comprehensive metabolic panel morning 7. Acute blood loss anemia 1. Patient presented with hemoglobin of 5 and complaints of dark appearing stools that were noted be heme positive in the emergency department 2. Concerns for acute low blood loss anemia secondary to GI bleeding 3. Gastroenterology is on board. Discussed with service today. Recommendations for continued supportive care and transfusions as needed. 4. Unfortunately, per GI, patient is likely too unstable to tolerate any kind of endoscopy at this time 8. Anxiety/depression 1. Unable to determine this time as patient is mildly sedated 9. Continued Alcohol abuse, withdrawals 1. Patient reportedly drank at least a sixpack of beer daily for the past several years 2. Last alcohol intake was on the morning of  hospital admission 3. This morning, patient  noted to have increased signs of alcohol withdrawal with a CIWA score of 9 4. We'll continue Ativan as needed 10. End-of-life 1. Was able to contact patient's power of attorney, his mother, who is currently out of state. 2. Lengthy discussion was made with patient's power of attorney. Patient's mother was fully updated on his condition and overall poor prognosis. All questions were answered 3. During phone conversation, patient's mother confirms that the patient's wishes are noted to be DO NOT RESUSCITATE and DO NOT INTUBATE. DO NOT RESUSCITATE orders have since been placed as of today.  DVT prophylaxis: SCDs Code Status: DO NOT RESUSCITATE Family Communication: Discussed with patient's mother over the phone Disposition Plan: Uncertain at this time  Consultants:   Nephrology  Gastroenterology  Procedures:   Ultrasound-guided paracentesis on 03/15/2016  Antimicrobials: Anti-infectives    Start     Dose/Rate Route Frequency Ordered Stop   03/16/16 0830  cefTRIAXone (ROCEPHIN) 2 g in dextrose 5 % 50 mL IVPB     2 g 100 mL/hr over 30 Minutes Intravenous Every 24 hours 03/16/16 0820     03/15/16 1200  vancomycin (VANCOCIN) 1,500 mg in sodium chloride 0.9 % 500 mL IVPB     1,500 mg 250 mL/hr over 120 Minutes Intravenous  Once 03/15/16 1145 03/15/16 1532   03/15/16 1145  vancomycin (VANCOCIN) 1,250 mg in sodium chloride 0.9 % 250 mL IVPB  Status:  Discontinued     1,250 mg 166.7 mL/hr over 90 Minutes Intravenous  Once 03/15/16 1137 03/15/16 1145   03/15/16 1145  ceFEPIme (MAXIPIME) 2 g in dextrose 5 % 50 mL IVPB     2 g 100 mL/hr over 30 Minutes Intravenous  Once 03/15/16 1137 03/15/16 1236      Subjective: Unable to determine as patient is mildly sedated  Objective: Vitals:   03/16/16 0600 03/16/16 0802 03/16/16 1107 03/16/16 1210  BP: 94/64 97/66    Pulse: (!) 103 (!) 107  (!) 102  Resp: 20 (!) 22  (!) 21  Temp:  97.4 F (36.3 C)  97.7 F (36.5 C)  TempSrc:  Oral   Axillary  SpO2: 96% 95% 91% 94%  Weight:      Height:        Intake/Output Summary (Last 24 hours) at 03/16/16 1607 Last data filed at 03/16/16 1500  Gross per 24 hour  Intake               50 ml  Output             1200 ml  Net            -1150 ml   Filed Weights   03/15/16 1029 03/16/16 0400  Weight: 79.4 kg (175 lb) 79.4 kg (175 lb 1.4 oz)    Examination:  General exam: Lethargic, lying in bed, appears somewhat uncomfortable  Respiratory system: Normal respiratory effort, no audible wheezing, no crackles. Cardiovascular system: Tachycardic, S1-S2 Gastrointestinal system: Positive bowel sounds, underlying masses are palpable through the abdomen, nondistended Central nervous system: Cranial nerves II-12 are grossly intact, mild tremor noted Extremities: No clubbing, perfused Skin: Normal skin turgor, no notable skin lesions seen Psychiatry: Unable to assess as patient is mildly sedated at this time  Data Reviewed: I have personally reviewed following labs and imaging studies  CBC:  Recent Labs Lab 03/14/16 0843 03/15/16 1041 03/16/16 0324  WBC 23.1* 29.3* 23.0*  NEUTROABS 20,328* 25.4*  --   HGB  6.2* 5.6* 7.3*  HCT 19.1* 16.6* 20.6*  MCV 98.5 96.5 91.2  PLT 339 315 [id number]   Basic Metabolic Panel:  Recent Labs Lab 03/15/16 1041 03/15/16 1524 03/15/16 2100 03/16/16 0324 03/16/16 0931  NA 109* 110* 111* 113* 112*  K 5.4* 5.0 4.8 4.6 4.5  CL 75* 80* 82* 84* 83*  CO2 20* 22 22 21* 20*  GLUCOSE 89 78 72 72 79  BUN 17 15 14 15 16   CREATININE 0.87 0.67 0.66 0.73 0.74  CALCIUM 7.4* 6.8* 6.8* 7.1* 7.1*   GFR: Estimated Creatinine Clearance: 101.4 mL/min (by C-G formula based on SCr of 0.74 mg/dL). Liver Function Tests:  Recent Labs Lab 03/14/16 0843 03/15/16 1041 03/16/16 0324  AST 511* 570* 493*  ALT 169* 201* 174*  ALKPHOS 373* 322* 297*  BILITOT 2.5* 3.3* 3.9*  PROT 5.8* 6.1* 5.6*  ALBUMIN 2.0* 1.8* 1.7*   No results for input(s): LIPASE, AMYLASE  in the last 168 hours.  Recent Labs Lab 03/15/16 1041  AMMONIA 22   Coagulation Profile:  Recent Labs Lab 03/15/16 1041  INR 1.43   Cardiac Enzymes: No results for input(s): CKTOTAL, CKMB, CKMBINDEX, TROPONINI in the last 168 hours. BNP (last 3 results) No results for input(s): PROBNP in the last 8760 hours. HbA1C: No results for input(s): HGBA1C in the last 72 hours. CBG: No results for input(s): GLUCAP in the last 168 hours. Lipid Profile: No results for input(s): CHOL, HDL, LDLCALC, TRIG, CHOLHDL, LDLDIRECT in the last 72 hours. Thyroid Function Tests:  Recent Labs  03/14/16 0843  TSH 3.61   Anemia Panel:  Recent Labs  03/14/16 0843  VITAMINB12 >2000*  FOLATE 6.3  FERRITIN 2,563*  TIBC 194*  IRON 35*  RETICCTPCT 9.9   Sepsis Labs:  Recent Labs Lab 03/15/16 1041 03/15/16 1524 03/16/16 0324  LATICACIDVEN 6.1* 2.6* 1.2    Recent Results (from the past 240 hour(s))  Culture, body fluid-bottle     Status: None (Preliminary result)   Collection Time: 03/15/16  2:49 PM  Result Value Ref Range Status   Specimen Description FLUID ASCITIC COLLECTED BY DOCTOR WATTS  Final   Special Requests BOTTLES DRAWN AEROBIC AND ANAEROBIC 10CC EACH  Final   Culture NO GROWTH < 24 HOURS  Final   Report Status PENDING  Incomplete  Gram stain     Status: None   Collection Time: 03/15/16  2:49 PM  Result Value Ref Range Status   Specimen Description FLUID ASCITIC COLLECTED BY DOCTOR WATTS  Final   Special Requests NONE  Final   Gram Stain   Final    ABUNDANT WBC PRESENT, PREDOMINANTLY PMN NO ORGANISMS SEEN Performed at [facility]    Report Status 03/15/2016 FINAL  Final  MRSA PCR Screening     Status: None   Collection Time: 03/15/16  3:47 PM  Result Value Ref Range Status   MRSA by PCR NEGATIVE NEGATIVE Final    Comment:        The GeneXpert MRSA Assay (FDA approved for NASAL specimens only), is one component of a comprehensive MRSA  colonization surveillance program. It is not intended to diagnose MRSA infection nor to guide or monitor treatment for MRSA infections.      Radiology Studies: Dg Chest 2 View  Result Date: 03/15/2016 CLINICAL DATA:  Weakness for 10 days. EXAM: CHEST  2 VIEW COMPARISON:  Radiographs of December 26, 2014. FINDINGS: The heart size and mediastinal contours are within normal limits. Atherosclerosis of thoracic aorta  is noted. No pneumothorax or pleural effusion is noted. Rounded density is seen in left midlung, with another rounded density seen in right lower lobe ; these are concerning for metastatic lesions. Smaller nodule is noted in right upper lobe. The visualized skeletal structures are unremarkable. IMPRESSION: Bilateral pulmonary nodules are noted concerning for metastatic disease. CT scan of the chest is recommended for further evaluation. Aortic atherosclerosis. Electronically Signed   By: Marijo Conception, M.D.   On: 03/15/2016 11:14   Ct Head Wo Contrast  Result Date: 03/15/2016 CLINICAL DATA:  60 year old male with generalized weakness, confusion and difficulty walking for 2 weeks. EXAM: CT HEAD WITHOUT CONTRAST TECHNIQUE: Contiguous axial images were obtained from the base of the skull through the vertex without intravenous contrast. COMPARISON:  None. FINDINGS: Brain: No evidence of acute infarction, hemorrhage, hydrocephalus, extra-axial collection or mass lesion/mass effect. Mild atrophy and chronic small-vessel white matter ischemic changes noted. Vascular: No hyperdense vessel or unexpected calcification. Skull: Normal. Negative for fracture or focal lesion. Sinuses/Orbits: No acute finding. Other: None. IMPRESSION: No evidence of acute intracranial abnormality. Mild atrophy and chronic small-vessel white matter ischemic changes. Electronically Signed   By: Margarette Canada M.D.   On: 03/15/2016 12:27   Ct Chest W Contrast  Result Date: 03/15/2016 CLINICAL DATA:  Anemia and hyponatremia.  EXAM: CT CHEST, ABDOMEN, AND PELVIS WITH CONTRAST TECHNIQUE: Multidetector CT imaging of the chest, abdomen and pelvis was performed following the standard protocol during bolus administration of intravenous contrast. CONTRAST:  163mL ISOVUE-300 IOPAMIDOL (ISOVUE-300) INJECTION 61% COMPARISON:  None. FINDINGS: CT CHEST FINDINGS Cardiovascular: No cardiomegaly or pericardial effusion. Diffuse atherosclerotic calcification, including the coronary arteries. Mediastinum/Nodes: Diffuse thickening of the esophagus with luminal irregularity. The thickening does not enhance typical of varices. Surprising absence of mediastinal adenopathy. Lungs/Pleura: Innumerable lobulated discrete nodules consistent with metastatic disease, measuring up to 27 mm at the right base. No effusion or pneumothorax. No superimposed pneumonia or edema. Musculoskeletal: No acute or aggressive finding CT ABDOMEN PELVIS FINDINGS Hepatobiliary: Cirrhotic liver morphology with recannulized umbilical vein and innumerable bulky masses. A segment 4B mass extends through the capsule. No evidence of biliary obstruction or stone. Pancreas: Unremarkable. Spleen: Lobulated mass in the anterior spleen measuring up to 4 cm, presumed metastasis. There are at least 2 other smaller hypo enhancing areas. Adrenals/Urinary Tract: 22 by 16 mm right adrenal mass consistent with metastasis in this setting. No hydronephrosis or stone. Unremarkable bladder. Stomach/Bowel:  No obstruction or mass. No inflammatory findings. Vascular/Lymphatic: Portal hypertension with recannulized periumbilical vein. Adenopathy in the upper abdominal ligaments and pre/periaortic locations consistent with metastases. Reproductive:No pathologic findings. Other: Moderate ascites without generalized peritoneal nodularity. Musculoskeletal: No acute or aggressive finding. IMPRESSION: 1. Widespread metastatic disease present in the lungs, liver, abdominal nodes, right adrenal gland, and spleen.  Esophageal adenocarcinoma or hepatocellular carcinoma (background cirrhosis) is the favored primary. 2. Moderate ascites. One of the large left liver masses has extracapsular growth. Electronically Signed   By: Monte Fantasia M.D.   On: 03/15/2016 12:31   Ct Abdomen Pelvis W Contrast  Result Date: 03/15/2016 CLINICAL DATA:  Anemia and hyponatremia. EXAM: CT CHEST, ABDOMEN, AND PELVIS WITH CONTRAST TECHNIQUE: Multidetector CT imaging of the chest, abdomen and pelvis was performed following the standard protocol during bolus administration of intravenous contrast. CONTRAST:  122mL ISOVUE-300 IOPAMIDOL (ISOVUE-300) INJECTION 61% COMPARISON:  None. FINDINGS: CT CHEST FINDINGS Cardiovascular: No cardiomegaly or pericardial effusion. Diffuse atherosclerotic calcification, including the coronary arteries. Mediastinum/Nodes: Diffuse thickening of the esophagus with  luminal irregularity. The thickening does not enhance typical of varices. Surprising absence of mediastinal adenopathy. Lungs/Pleura: Innumerable lobulated discrete nodules consistent with metastatic disease, measuring up to 27 mm at the right base. No effusion or pneumothorax. No superimposed pneumonia or edema. Musculoskeletal: No acute or aggressive finding CT ABDOMEN PELVIS FINDINGS Hepatobiliary: Cirrhotic liver morphology with recannulized umbilical vein and innumerable bulky masses. A segment 4B mass extends through the capsule. No evidence of biliary obstruction or stone. Pancreas: Unremarkable. Spleen: Lobulated mass in the anterior spleen measuring up to 4 cm, presumed metastasis. There are at least 2 other smaller hypo enhancing areas. Adrenals/Urinary Tract: 22 by 16 mm right adrenal mass consistent with metastasis in this setting. No hydronephrosis or stone. Unremarkable bladder. Stomach/Bowel:  No obstruction or mass. No inflammatory findings. Vascular/Lymphatic: Portal hypertension with recannulized periumbilical vein. Adenopathy in the upper  abdominal ligaments and pre/periaortic locations consistent with metastases. Reproductive:No pathologic findings. Other: Moderate ascites without generalized peritoneal nodularity. Musculoskeletal: No acute or aggressive finding. IMPRESSION: 1. Widespread metastatic disease present in the lungs, liver, abdominal nodes, right adrenal gland, and spleen. Esophageal adenocarcinoma or hepatocellular carcinoma (background cirrhosis) is the favored primary. 2. Moderate ascites. One of the large left liver masses has extracapsular growth. Electronically Signed   By: Monte Fantasia M.D.   On: 03/15/2016 12:31   US Paracentesis  Result Date: 03/15/2016 INDICATION: Malignant ascites. EXAM: ULTRASOUND GUIDED THERAPEUTIC AND DIAGNOSTIC PARACENTESIS MEDICATIONS: None. COMPLICATIONS: None immediate. PROCEDURE: Exam was described to the patient who was subsequently treated with Ativan for signs of withdrawal. Before the procedure the patient had good understanding of the risks and benefits of the procedure and written informed consent was obtained from the patient. A timeout was performed prior to the initiation of the procedure. Initial ultrasound scanning demonstrates an adequate amount of ascites within the right lower abdominal quadrant. The right lower abdomen was prepped and draped in the usual sterile fashion. 1% lidocaine was used for local anesthesia. Following this, a 19 gauge, 7-cm, Yueh catheter was introduced. An ultrasound image was saved for documentation purposes. The paracentesis was performed. The catheter was removed and a dressing was applied. The patient tolerated the procedure well without immediate post procedural complication. FINDINGS: A total of approximately 1,100 of blood-tinged and cloudy fluid was removed. Samples were sent to the laboratory and pathology as requested by the clinical team. IMPRESSION: Successful ultrasound-guided paracentesis yielding 1.1 liters of peritoneal fluid.  Electronically Signed   By: Monte Fantasia M.D.   On: 03/15/2016 15:27    Scheduled Meds: . cefTRIAXone (ROCEPHIN)  IV  2 g Intravenous Q24H  . feeding supplement (PRO-STAT SUGAR FREE 64)  30 mL Oral QID  . folic acid  1 mg Oral Daily  . LORazepam  0-4 mg Intravenous Q6H   Followed by  . [START ON 03/17/2016] LORazepam  0-4 mg Intravenous Q12H  . multivitamin with minerals  1 tablet Oral Daily  . sodium chloride flush  3 mL Intravenous Q12H  . thiamine  100 mg Oral Daily   Or  . thiamine  100 mg Intravenous Daily   Continuous Infusions:    LOS: 1 day   CHIU, Orpah Melter, MD [facility] Pager [phone]  If 7PM-7AM, please contact night-coverage [email or web address] Password [email or web address] 03/16/2016, 4:07 PM

## 2016-03-16 NOTE — Progress Notes (Addendum)
Jonathan Hodges  MRN: [id number]  DOB/AGE: December 16, 1955 60 y.o.  Primary Care Physician:DIXON,MARY BETH, PA-C  Admit date: 03/15/2016  Chief Complaint:  Chief Complaint  Patient presents with  . Abnormal Lab  . Weakness    S-Pt presented on  03/15/2016 with  Chief Complaint  Patient presents with  . Abnormal Lab  . Weakness  .    Pt is lethargic, does not offer any complaints    meds  . cefTRIAXone (ROCEPHIN)  IV  2 g Intravenous Q24H  . feeding supplement (PRO-STAT SUGAR FREE 64)  30 mL Oral QID  . folic acid  1 mg Oral Daily  . LORazepam  0-4 mg Intravenous Q6H   Followed by  . [START ON 03/17/2016] LORazepam  0-4 mg Intravenous Q12H  . multivitamin with minerals  1 tablet Oral Daily  . sodium chloride flush  3 mL Intravenous Q12H  . thiamine  100 mg Oral Daily   Or  . thiamine  100 mg Intravenous Daily      Physical Exam: Vital signs in last 24 hours: Temp:  [97.4 F (36.3 C)-98.8 F (37.1 C)] 97.7 F (36.5 C) (10/13 1210) Pulse Rate:  [86-127] 102 (10/13 1210) Resp:  [16-25] 21 (10/13 1210) BP: (85-116)/(49-75) 97/66 (10/13 0802) SpO2:  [90 %-100 %] 94 % (10/13 1210) Weight:  [175 lb 1.4 oz (79.4 kg)] 175 lb 1.4 oz (79.4 kg) (10/13 0400) Weight change:  Last BM Date: 03/15/16  Intake/Output from previous day: 10/12 0701 - 10/13 0700 In: 670 [I.V.:10; Blood:660] Out: 500 [Urine:500] No intake/output data recorded.   Physical Exam: General- pt is lethargic Resp- No acute REsp distress, decreased at bases CVS- S1S2 regular in rate and rhythm GIT- BS+, distended EXT- 1+ LE Edema, NO Cyanosis    Lab Results: CBC  Recent Labs  03/15/16 1041 03/16/16 0324  WBC 29.3* 23.0*  HGB 5.6* 7.3*  HCT 16.6* 20.6*  PLT 315 203    BMET  Recent Labs  03/16/16 0324 03/16/16 0931  NA 113* 112*  K 4.6 4.5  CL 84* 83*  CO2 21* 20*  GLUCOSE 72 79  BUN 15 16  CREATININE 0.73 0.74  CALCIUM 7.1* 7.1*   Sodium 109==> 113    MICRO Recent  Results (from the past 240 hour(s))  Culture, body fluid-bottle     Status: None (Preliminary result)   Collection Time: 03/15/16  2:49 PM  Result Value Ref Range Status   Specimen Description FLUID ASCITIC COLLECTED BY DOCTOR WATTS  Final   Special Requests BOTTLES DRAWN AEROBIC AND ANAEROBIC 10CC EACH  Final   Culture NO GROWTH < 24 HOURS  Final   Report Status PENDING  Incomplete  Gram stain     Status: None   Collection Time: 03/15/16  2:49 PM  Result Value Ref Range Status   Specimen Description FLUID ASCITIC COLLECTED BY DOCTOR WATTS  Final   Special Requests NONE  Final   Gram Stain   Final    ABUNDANT WBC PRESENT, PREDOMINANTLY PMN NO ORGANISMS SEEN Performed at [facility]    Report Status 03/15/2016 FINAL  Final  MRSA PCR Screening     Status: None   Collection Time: 03/15/16  3:47 PM  Result Value Ref Range Status   MRSA by PCR NEGATIVE NEGATIVE Final    Comment:        The GeneXpert MRSA Assay (FDA approved for NASAL specimens only), is one component of a comprehensive MRSA colonization surveillance program. It  is not intended to diagnose MRSA infection nor to guide or monitor treatment for MRSA infections.       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 Adrenal deficiency        Low Na        High K        Low Bicarb        Adrenal mass   9) ID -admitted with High WBC       On IV abx    10) Social-Pt with poor prognosis sec to metastatic cancer  Plan:   Will ask for repeat urine na Will ask for free water restriction Will follow bmet      [place] S 03/16/2016, 12:40 PM

## 2016-03-17 DIAGNOSIS — E860 Dehydration: Secondary | ICD-10-CM

## 2016-03-17 LAB — AFP TUMOR MARKER: AFP-Tumor Marker: 70.8 ng/mL — ABNORMAL HIGH (ref 0.0–8.3)

## 2016-03-17 LAB — CBC
HCT: 20.4 % — ABNORMAL LOW (ref 39.0–52.0)
Hemoglobin: 7 g/dL — ABNORMAL LOW (ref 13.0–17.0)
MCH: 32.3 pg (ref 26.0–34.0)
MCHC: 34.3 g/dL (ref 30.0–36.0)
MCV: 94 fL (ref 78.0–100.0)
PLATELETS: 188 10*3/uL (ref 150–400)
RBC: 2.17 MIL/uL — AB (ref 4.22–5.81)
RDW: 18.5 % — AB (ref 11.5–15.5)
WBC: 19.4 10*3/uL — ABNORMAL HIGH (ref 4.0–10.5)

## 2016-03-17 LAB — BASIC METABOLIC PANEL
Anion gap: 9 (ref 5–15)
Anion gap: 9 (ref 5–15)
BUN: 15 mg/dL (ref 6–20)
BUN: 15 mg/dL (ref 6–20)
CO2: 22 mmol/L (ref 22–32)
CO2: 21 mmol/L — ABNORMAL LOW (ref 22–32)
Calcium: 7.1 mg/dL — ABNORMAL LOW (ref 8.9–10.3)
Calcium: 7 mg/dL — ABNORMAL LOW (ref 8.9–10.3)
Chloride: 85 mmol/L — ABNORMAL LOW (ref 101–111)
Chloride: 85 mmol/L — ABNORMAL LOW (ref 101–111)
Creatinine, Ser: 0.7 mg/dL (ref 0.61–1.24)
Creatinine, Ser: 0.67 mg/dL (ref 0.61–1.24)
GFR calc Af Amer: >60 mL/min (ref >60)
GFR calc Af Amer: >60 mL/min (ref >60)
GFR calc non Af Amer: >60 mL/min (ref >60)
GFR calc non Af Amer: >60 mL/min (ref >60)
Glucose, Bld: 73 mg/dL (ref 65–99)
Glucose, Bld: 78 mg/dL (ref 65–99)
Potassium: 4.4 mmol/L (ref 3.5–5.1)
Potassium: 4.3 mmol/L (ref 3.5–5.1)
Sodium: 116 mmol/L — CL (ref 135–145)
Sodium: 115 mmol/L — CL (ref 135–145)

## 2016-03-17 LAB — HEPATIC FUNCTION PANEL
ALK PHOS: 272 U/L — AB (ref 38–126)
ALT: 147 U/L — AB (ref 17–63)
AST: 375 U/L — AB (ref 15–41)
Albumin: 1.6 g/dL — ABNORMAL LOW (ref 3.5–5.0)
BILIRUBIN DIRECT: 1.5 mg/dL — AB (ref 0.1–0.5)
BILIRUBIN TOTAL: 2.7 mg/dL — AB (ref 0.3–1.2)
Indirect Bilirubin: 1.2 mg/dL — ABNORMAL HIGH (ref 0.3–0.9)
Total Protein: 5.4 g/dL — ABNORMAL LOW (ref 6.5–8.1)

## 2016-03-17 MED ORDER — ORAL CARE MOUTH RINSE: 15.0000 mL | Freq: Two times a day (BID) | OROMUCOSAL | Status: DC

## 2016-03-17 MED ORDER — CHLORHEXIDINE GLUCONATE 0.12 % MT SOLN
15.0000 mL | Freq: Two times a day (BID) | OROMUCOSAL | Status: DC
  Administered 2016-03-17 – 2016-03-19 (×4): 15 mL via OROMUCOSAL
  Filled 2016-03-17 (×3): qty 15

## 2016-03-17 MED ORDER — FAMOTIDINE IN NACL 20-0.9 MG/50ML-% IV SOLN
20.0000 mg | Freq: Two times a day (BID) | INTRAVENOUS | Status: DC
  Administered 2016-03-17 (×2): 20 mg via INTRAVENOUS
  Filled 2016-03-17 (×2): qty 50

## 2016-03-17 NOTE — Progress Notes (Addendum)
PROGRESS NOTE    Jonathan Hodges  [id number] DOB: 06-16-1955 DOA: 03/15/2016 PCP: Karis Juba, PA-C    Brief Narrative:  60 y.o. male with medical history significant of chronic alcohol abuse, depression, hyperlipidemia, hypertension who initially followed up with his primary care provider one day prior to hospital admission for routine follow-up. Routine blood work was obtained at that time. On the day of hospital admission, patient was instructed to present to the hospital after being found to have a critical low sodium and low hemoglobin. On further questioning, patient reports increased weakness over the past several weeks prior to this hospital admission. In addition, patient also noted abdominal distention that started 2 weeks ago. Patient denies chest pain or shortness of breath. Patient denies lower extremity edema. Patient also also reports drinking a sixpack of beer on a daily basis for several years. Last alcohol intake was on the morning of hospital admission  Assessment & Plan:   Principal Problem:   Hyponatremia Active Problems:   Hyperlipidemia   Hypertension   GAD (generalized anxiety disorder)   Alcohol abuse, daily use   Alcoholic cirrhosis of liver with ascites (HCC)   Malignant neoplasm of liver (HCC)   Acute blood loss anemia   Metastatic cancer (HCC)   Ascites   Lactic acidemia   Malignant ascites   Symptomatic anemia   Weakness   1. Widespread metastatic disease of uncertain primary 1. CT scan had demonstrated widespread metastatic disease as previously noted 2. Patient underwent ultrasound-guided paracentesis on 03/15/2016. Cytology still pending 3. Overall grim prognosis. 4. I had discussed the case with oncologist on call, Dr. Whitney Muse. All labs, studies, x-rays were reviewed with oncology. Per oncology, patient's functional status is very poor and thus he will likely not tolerate aggressive cancer treatment. Per oncology, prognosis is very grim with  anticipated prognosis of less than 2 weeks. Oncology endorses hospice care. We'll discuss this with family when they arrive. Patient's mother is expected to arrive later tonight or early tomorrow from out of state. In the meantime, patient is noted to have an eldest son who is not reachable or available at this time. Instead, patient's oldest daughter is present who will legally make medical decisions at this time. 2. Hyponatremia 1. Recent sodium of 109 2. Greatly appreciate assistance by nephrology 3. Patient is continued on fluid restriction 4. Repeat metabolic panel in the morning 5. Sodium is somewhat improved although still low 3. Ascites, possible SBP versus malignant ascites 1. Patient noted to have moderate ascites at presentation 2. Greater than 7000 white blood cells noted, 91% neutrophils 3. Patient was started on empiric Rocephin to cover for SBP 4. Labs reviewed personally. Thus far, fluid has shown no bacterial growth 5. Suspect malignant ascites. Cytology remains pending at this time 4. Hypertension 1. Blood pressure currently stable, remains soft 2. Tolerating Ativan thus far 5. Hyperlipidemia 1. Patient reportedly on statin prior to admission, remains on hold for now 6. Alcoholic cirrhosis 1. Liver profile elevated on presentation 2. Suspect multifactorial etiology including alcoholic hepatitis, shock liver, malignancy 3. We'll follow comprehensive metabolic panel in morning 7. Acute blood loss anemia 1. Presenting hemoglobin of 5 with reports of dark heme positive stools. 2. Appreciate input by gastroenterology. 3. Patient is at this time too unstable to tolerate invasive procedures such as endoscopy 4. Hemoglobin presently 7.0 5. Discuss with gastroenterology. Given hyponatremia that is somewhat responsive to fluid restriction, GI recommendations to hold off on blood transfusion at this time. 8. Anxiety/depression 1.  Unable to fully assess as patient is sedate this  morning 9. Continued Alcohol abuse, withdrawals 1. Patient reported to me of drinking at least a sixpack of beer daily for several years 2. Outpatient records were reviewed. Patient noted to have drank at least 12 pack of beer on some days. His alcohol abuse was reportedly addressed as an outpatient per records 3. Continue CIWA. Score of 10 noted this morning. Continue Ativan 4. We'll have to be very cautious for seizures as patient seizure threshold is now very low in the setting of hyponatremia and alcohol withdrawals 10. End-of-life 1. See previous documentation. 2. Patient's daughter now present and in agreement with current plan of care. 3. Patient's daughter is currently the eldest sibling who was available at this time to help make decisions and will be the legal decision-maker. 4. All labs and x-ray images were shown to family during family meeting this morning. All questions were answered 5. Patient's mother is anticipated to arrive later today or early tomorrow. 6. Family agrees that aggressive care will likely be futile. For now, will await for other family members to arrive before discussing hospice.  DVT prophylaxis: SCDs Code Status: DO NOT RESUSCITATE Family Communication: Discussed with patient's eldest daughter in room Disposition Plan: Uncertain at this time  Consultants:   Nephrology  Gastroenterology  Discussed case with oncology on call (Dr. Whitney Muse)  Procedures:   Ultrasound-guided paracentesis on 03/15/2016  Antimicrobials: Anti-infectives    Start     Dose/Rate Route Frequency Ordered Stop   03/16/16 0830  cefTRIAXone (ROCEPHIN) 2 g in dextrose 5 % 50 mL IVPB     2 g 100 mL/hr over 30 Minutes Intravenous Every 24 hours 03/16/16 0820     03/15/16 1200  vancomycin (VANCOCIN) 1,500 mg in sodium chloride 0.9 % 500 mL IVPB     1,500 mg 250 mL/hr over 120 Minutes Intravenous  Once 03/15/16 1145 03/15/16 1532   03/15/16 1145  vancomycin (VANCOCIN) 1,250 mg in  sodium chloride 0.9 % 250 mL IVPB  Status:  Discontinued     1,250 mg 166.7 mL/hr over 90 Minutes Intravenous  Once 03/15/16 1137 03/15/16 1145   03/15/16 1145  ceFEPIme (MAXIPIME) 2 g in dextrose 5 % 50 mL IVPB     2 g 100 mL/hr over 30 Minutes Intravenous  Once 03/15/16 1137 03/15/16 1236      Subjective: Cannot fully assess as patient remains sedated  Objective: Vitals:   03/17/16 1230 03/17/16 1245 03/17/16 1300 03/17/16 1645  BP:  108/67 110/69   Pulse: 100 98 98 94  Resp: (!) 21 (!) 26 19 (!) 21  Temp:    97.4 F (36.3 C)  TempSrc:    Axillary  SpO2:    99%  Weight:      Height:        Intake/Output Summary (Last 24 hours) at 03/17/16 1646 Last data filed at 03/17/16 1646  Gross per 24 hour  Intake                0 ml  Output              950 ml  Net             -950 ml   Filed Weights   03/15/16 1029 03/16/16 0400 03/17/16 0500  Weight: 79.4 kg (175 lb) 79.4 kg (175 lb 1.4 oz) 86.8 kg (191 lb 5.8 oz)    Examination:  General exam: Remains lethargic, lying in bed,  arousable Respiratory system: Normal chest rise, no wheezing. Cardiovascular system: Regular rhythm, S1-S2 Gastrointestinal system: Palpable masses midline abdomen, nontender Central nervous system: No seizures, sensation intact Extremities: No cyanosis, no joint deformities Skin: No rashes, no pallor, appears jaundiced Psychiatry: Cannot assess secondary to patient being sedated, occasionally opening his eyes  Data Reviewed: I have personally reviewed following labs and imaging studies  CBC:  Recent Labs Lab 03/14/16 0843 03/15/16 1041 03/16/16 0324 03/17/16 0431  WBC 23.1* 29.3* 23.0* 19.4*  NEUTROABS 20,328* 25.4*  --   --   HGB 6.2* 5.6* 7.3* 7.0*  HCT 19.1* 16.6* 20.6* 20.4*  MCV 98.5 96.5 91.2 94.0  PLT 339 315 203 0000000   Basic Metabolic Panel:  Recent Labs Lab 03/16/16 0931 03/16/16 1530 03/16/16 2142 03/17/16 0431 03/17/16 0949  NA 112* 114* 115* 116* 115*  K 4.5 4.7  4.5 4.4 4.3  CL 83* 84* 85* 85* 85*  CO2 20* 21* 22 22 21*  GLUCOSE 79 75 72 73 78  BUN 16 15 15 15 15   CREATININE 0.74 0.70 0.75 0.70 0.67  CALCIUM 7.1* 7.0* 7.0* 7.1* 7.0*   GFR: Estimated Creatinine Clearance: 101.4 mL/min (by C-G formula based on SCr of 0.67 mg/dL). Liver Function Tests:  Recent Labs Lab 03/14/16 0843 03/15/16 1041 03/16/16 0324 03/17/16 0431  AST 511* 570* 493* 375*  ALT 169* 201* 174* 147*  ALKPHOS 373* 322* 297* 272*  BILITOT 2.5* 3.3* 3.9* 2.7*  PROT 5.8* 6.1* 5.6* 5.4*  ALBUMIN 2.0* 1.8* 1.7* 1.6*   No results for input(s): LIPASE, AMYLASE in the last 168 hours.  Recent Labs Lab 03/15/16 1041  AMMONIA 22   Coagulation Profile:  Recent Labs Lab 03/15/16 1041  INR 1.43   Cardiac Enzymes: No results for input(s): CKTOTAL, CKMB, CKMBINDEX, TROPONINI in the last 168 hours. BNP (last 3 results) No results for input(s): PROBNP in the last 8760 hours. HbA1C: No results for input(s): HGBA1C in the last 72 hours. CBG: No results for input(s): GLUCAP in the last 168 hours. Lipid Profile: No results for input(s): CHOL, HDL, LDLCALC, TRIG, CHOLHDL, LDLDIRECT in the last 72 hours. Thyroid Function Tests: No results for input(s): TSH, T4TOTAL, FREET4, T3FREE, THYROIDAB in the last 72 hours. Anemia Panel: No results for input(s): VITAMINB12, FOLATE, FERRITIN, TIBC, IRON, RETICCTPCT in the last 72 hours. Sepsis Labs:  Recent Labs Lab 03/15/16 1041 03/15/16 1524 03/16/16 0324  LATICACIDVEN 6.1* 2.6* 1.2    Recent Results (from the past 240 hour(s))  Culture, body fluid-bottle     Status: None (Preliminary result)   Collection Time: 03/15/16  2:49 PM  Result Value Ref Range Status   Specimen Description FLUID ASCITIC COLLECTED BY DOCTOR WATTS  Final   Special Requests BOTTLES DRAWN AEROBIC AND ANAEROBIC 10CC EACH  Final   Culture NO GROWTH 2 DAYS  Final   Report Status PENDING  Incomplete  Gram stain     Status: None   Collection Time:  03/15/16  2:49 PM  Result Value Ref Range Status   Specimen Description FLUID ASCITIC COLLECTED BY DOCTOR WATTS  Final   Special Requests NONE  Final   Gram Stain   Final    ABUNDANT WBC PRESENT, PREDOMINANTLY PMN NO ORGANISMS SEEN Performed at [facility]    Report Status 03/15/2016 FINAL  Final  MRSA PCR Screening     Status: None   Collection Time: 03/15/16  3:47 PM  Result Value Ref Range Status   MRSA by PCR NEGATIVE  NEGATIVE Final    Comment:        The GeneXpert MRSA Assay (FDA approved for NASAL specimens only), is one component of a comprehensive MRSA colonization surveillance program. It is not intended to diagnose MRSA infection nor to guide or monitor treatment for MRSA infections.      Radiology Studies: No results found.  Scheduled Meds: . cefTRIAXone (ROCEPHIN)  IV  2 g Intravenous Q24H  . famotidine (PEPCID) IV  20 mg Intravenous Q12H  . feeding supplement (PRO-STAT SUGAR FREE 64)  30 mL Oral QID  . folic acid  1 mg Oral Daily  . LORazepam  0-4 mg Intravenous Q12H  . multivitamin with minerals  1 tablet Oral Daily  . sodium chloride flush  3 mL Intravenous Q12H  . thiamine  100 mg Oral Daily   Or  . thiamine  100 mg Intravenous Daily   Continuous Infusions:    LOS: 2 days   CHIU, Orpah Melter, MD [facility] Pager [phone]  If 7PM-7AM, please contact night-coverage [email or web address] Password [email or web address] 03/17/2016, 4:46 PM

## 2016-03-17 NOTE — Progress Notes (Signed)
Subjective:  Daughters at bedside. Patient does not awake.   Objective: Vital signs in last 24 hours: Temp:  [96.8 F (36 C)-98.1 F (36.7 C)] 97.6 F (36.4 C) (10/14 0745) Pulse Rate:  [100-110] 106 (10/14 0800) Resp:  [20-39] 22 (10/14 0800) BP: (91-111)/(57-73) 91/64 (10/14 0800) SpO2:  [91 %-97 %] 97 % (10/14 0800) Weight:  [191 lb 5.8 oz (86.8 kg)] 191 lb 5.8 oz (86.8 kg) (10/14 0500) Last BM Date: 03/16/16 (smear on pad) General:   Does not awake to verbal stimuli, daughters at bedside, julie/Jessie.  HEAD: Normocephalic and atraumatic. Eyes:  Sclera iicterus.  Chest: CTA bilaterally without rales, rhonchi, crackles.    Heart:  Regular rate and rhythm; no murmurs, clicks, rubs,  or gallops. Abdomen:  Soft, mildly distended. Liver edge/mass easily palpated in epigastrium. Normal bowel sounds.   Extremities:  Without clubbing, deformity or edema. Neurologic:  sedated. Skin:  Intact without significant lesions or rashes.+jaundice Psych:  Could not evaluate.  Intake/Output from previous day: 10/13 0701 - 10/14 0700 In: 50 [IV Piggyback:50] Out: 1400 [Urine:1400] Intake/Output this shift: No intake/output data recorded.  Lab Results: CBC  Recent Labs  03/15/16 1041 03/16/16 0324 03/17/16 0431  WBC 29.3* 23.0* 19.4*  HGB 5.6* 7.3* 7.0*  HCT 16.6* 20.6* 20.4*  MCV 96.5 91.2 94.0  PLT 315 203 188   BMET  Recent Labs  03/16/16 1530 03/16/16 2142 03/17/16 0431  NA 114* 115* 116*  K 4.7 4.5 4.4  CL 84* 85* 85*  CO2 21* 22 22  GLUCOSE 75 72 73  BUN 15 15 15   CREATININE 0.70 0.75 0.70  CALCIUM 7.0* 7.0* 7.1*   LFTs  Recent Labs  03/15/16 1041 03/16/16 0324 03/17/16 0431  BILITOT 3.3* 3.9* 2.7*  BILIDIR  --   --  1.5*  IBILI  --   --  1.2*  ALKPHOS 322* 297* 272*  AST 570* 493* 375*  ALT 201* 174* 147*  PROT 6.1* 5.6* 5.4*  ALBUMIN 1.8* 1.7* 1.6*   No results for input(s): LIPASE in the last 72 hours. PT/INR  Recent Labs  03/15/16 1041   LABPROT 17.6*  INR 1.43      Imaging Studies: Dg Chest 2 View  Result Date: 03/15/2016 CLINICAL DATA:  Weakness for 10 days. EXAM: CHEST  2 VIEW COMPARISON:  Radiographs of December 26, 2014. FINDINGS: The heart size and mediastinal contours are within normal limits. Atherosclerosis of thoracic aorta is noted. No pneumothorax or pleural effusion is noted. Rounded density is seen in left midlung, with another rounded density seen in right lower lobe ; these are concerning for metastatic lesions. Smaller nodule is noted in right upper lobe. The visualized skeletal structures are unremarkable. IMPRESSION: Bilateral pulmonary nodules are noted concerning for metastatic disease. CT scan of the chest is recommended for further evaluation. Aortic atherosclerosis. Electronically Signed   By: Marijo Conception, M.D.   On: 03/15/2016 11:14   Ct Head Wo Contrast  Result Date: 03/15/2016 CLINICAL DATA:  60 year old male with generalized weakness, confusion and difficulty walking for 2 weeks. EXAM: CT HEAD WITHOUT CONTRAST TECHNIQUE: Contiguous axial images were obtained from the base of the skull through the vertex without intravenous contrast. COMPARISON:  None. FINDINGS: Brain: No evidence of acute infarction, hemorrhage, hydrocephalus, extra-axial collection or mass lesion/mass effect. Mild atrophy and chronic small-vessel white matter ischemic changes noted. Vascular: No hyperdense vessel or unexpected calcification. Skull: Normal. Negative for fracture or focal lesion. Sinuses/Orbits: No acute finding. Other: None.  IMPRESSION: No evidence of acute intracranial abnormality. Mild atrophy and chronic small-vessel white matter ischemic changes. Electronically Signed   By: Margarette Canada M.D.   On: 03/15/2016 12:27   Ct Chest W Contrast  Result Date: 03/15/2016 CLINICAL DATA:  Anemia and hyponatremia. EXAM: CT CHEST, ABDOMEN, AND PELVIS WITH CONTRAST TECHNIQUE: Multidetector CT imaging of the chest, abdomen and  pelvis was performed following the standard protocol during bolus administration of intravenous contrast. CONTRAST:  12mL ISOVUE-300 IOPAMIDOL (ISOVUE-300) INJECTION 61% COMPARISON:  None. FINDINGS: CT CHEST FINDINGS Cardiovascular: No cardiomegaly or pericardial effusion. Diffuse atherosclerotic calcification, including the coronary arteries. Mediastinum/Nodes: Diffuse thickening of the esophagus with luminal irregularity. The thickening does not enhance typical of varices. Surprising absence of mediastinal adenopathy. Lungs/Pleura: Innumerable lobulated discrete nodules consistent with metastatic disease, measuring up to 27 mm at the right base. No effusion or pneumothorax. No superimposed pneumonia or edema. Musculoskeletal: No acute or aggressive finding CT ABDOMEN PELVIS FINDINGS Hepatobiliary: Cirrhotic liver morphology with recannulized umbilical vein and innumerable bulky masses. A segment 4B mass extends through the capsule. No evidence of biliary obstruction or stone. Pancreas: Unremarkable. Spleen: Lobulated mass in the anterior spleen measuring up to 4 cm, presumed metastasis. There are at least 2 other smaller hypo enhancing areas. Adrenals/Urinary Tract: 22 by 16 mm right adrenal mass consistent with metastasis in this setting. No hydronephrosis or stone. Unremarkable bladder. Stomach/Bowel:  No obstruction or mass. No inflammatory findings. Vascular/Lymphatic: Portal hypertension with recannulized periumbilical vein. Adenopathy in the upper abdominal ligaments and pre/periaortic locations consistent with metastases. Reproductive:No pathologic findings. Other: Moderate ascites without generalized peritoneal nodularity. Musculoskeletal: No acute or aggressive finding. IMPRESSION: 1. Widespread metastatic disease present in the lungs, liver, abdominal nodes, right adrenal gland, and spleen. Esophageal adenocarcinoma or hepatocellular carcinoma (background cirrhosis) is the favored primary. 2. Moderate  ascites. One of the large left liver masses has extracapsular growth. Electronically Signed   By: Monte Fantasia M.D.   On: 03/15/2016 12:31   Ct Abdomen Pelvis W Contrast  Result Date: 03/15/2016 CLINICAL DATA:  Anemia and hyponatremia. EXAM: CT CHEST, ABDOMEN, AND PELVIS WITH CONTRAST TECHNIQUE: Multidetector CT imaging of the chest, abdomen and pelvis was performed following the standard protocol during bolus administration of intravenous contrast. CONTRAST:  153mL ISOVUE-300 IOPAMIDOL (ISOVUE-300) INJECTION 61% COMPARISON:  None. FINDINGS: CT CHEST FINDINGS Cardiovascular: No cardiomegaly or pericardial effusion. Diffuse atherosclerotic calcification, including the coronary arteries. Mediastinum/Nodes: Diffuse thickening of the esophagus with luminal irregularity. The thickening does not enhance typical of varices. Surprising absence of mediastinal adenopathy. Lungs/Pleura: Innumerable lobulated discrete nodules consistent with metastatic disease, measuring up to 27 mm at the right base. No effusion or pneumothorax. No superimposed pneumonia or edema. Musculoskeletal: No acute or aggressive finding CT ABDOMEN PELVIS FINDINGS Hepatobiliary: Cirrhotic liver morphology with recannulized umbilical vein and innumerable bulky masses. A segment 4B mass extends through the capsule. No evidence of biliary obstruction or stone. Pancreas: Unremarkable. Spleen: Lobulated mass in the anterior spleen measuring up to 4 cm, presumed metastasis. There are at least 2 other smaller hypo enhancing areas. Adrenals/Urinary Tract: 22 by 16 mm right adrenal mass consistent with metastasis in this setting. No hydronephrosis or stone. Unremarkable bladder. Stomach/Bowel:  No obstruction or mass. No inflammatory findings. Vascular/Lymphatic: Portal hypertension with recannulized periumbilical vein. Adenopathy in the upper abdominal ligaments and pre/periaortic locations consistent with metastases. Reproductive:No pathologic  findings. Other: Moderate ascites without generalized peritoneal nodularity. Musculoskeletal: No acute or aggressive finding. IMPRESSION: 1. Widespread metastatic disease present in the lungs,  liver, abdominal nodes, right adrenal gland, and spleen. Esophageal adenocarcinoma or hepatocellular carcinoma (background cirrhosis) is the favored primary. 2. Moderate ascites. One of the large left liver masses has extracapsular growth. Electronically Signed   By: Monte Fantasia M.D.   On: 03/15/2016 12:31   US Paracentesis  Result Date: 03/15/2016 INDICATION: Malignant ascites. EXAM: ULTRASOUND GUIDED THERAPEUTIC AND DIAGNOSTIC PARACENTESIS MEDICATIONS: None. COMPLICATIONS: None immediate. PROCEDURE: Exam was described to the patient who was subsequently treated with Ativan for signs of withdrawal. Before the procedure the patient had good understanding of the risks and benefits of the procedure and written informed consent was obtained from the patient. A timeout was performed prior to the initiation of the procedure. Initial ultrasound scanning demonstrates an adequate amount of ascites within the right lower abdominal quadrant. The right lower abdomen was prepped and draped in the usual sterile fashion. 1% lidocaine was used for local anesthesia. Following this, a 19 gauge, 7-cm, Yueh catheter was introduced. An ultrasound image was saved for documentation purposes. The paracentesis was performed. The catheter was removed and a dressing was applied. The patient tolerated the procedure well without immediate post procedural complication. FINDINGS: A total of approximately 1,100 of blood-tinged and cloudy fluid was removed. Samples were sent to the laboratory and pathology as requested by the clinical team. IMPRESSION: Successful ultrasound-guided paracentesis yielding 1.1 liters of peritoneal fluid. Electronically Signed   By: Monte Fantasia M.D.   On: 03/15/2016 15:27  [2 weeks]   Assessment:  60 y/o male  presented with 2-3 weeks of melena and acute decline in hgb, multiple electrolyte abnormalities including sodium of 109. LFTs all elevated, noted jaundice. Mass appreciated on physical exam. 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 CT ominous with "innumerable" liver masses and widespread metastasis. Primary likely esophageal versus liver vs HCC (per radiology).   Patient has not had overt GI bleeding since admission. Would need mid to upper 120s sodium level AT A MINIMUM to consider procedure. His sodium is improving.  LFT elevation and jaundice in setting of multiple liver masses, likely underlying cirrhosis +/- ?etoh hepatitis. Does not need prednisilone in this setting and DF 29.  Paracentesis performed yesterday which contained 1100 mL of blood-tinged and cloudy fluid was removed and sent to lab and pathology.. cell count shows his cloudy fluid, elevated blood cells 7015, elevated neutrophil count at 91%. Fluid protein normal, fluid albumin normal, fluid culture currently in process. Fluid Gram stain notes abundant white blood cells present predominately p.m. and without organisms seen. On Rocephin. Cytology pending.   CEA elevated at 58.8, cancer antigen 19-9 elevated at 200, AFP elevated at 78.8. Lactic acid today is improved to 1.2.  Overall prognosis grim.  Plan: 1. IV protonix 40mg  BID given h/o decline in hgb and ?melena. 2. Continue Rocephin. 3. Await cytology. 4. Monitor for GI bleeding.  5. I discussed briefly with Dr. Wyline Copas who spoke with Dr. Whitney Muse. Recommended to make patient comfort measures and there are plans to discuss with family today.   Laureen Ochs. Bernarda Caffey [facility] [phone] 10/14/20173:12 PM     LOS: 2 days

## 2016-03-17 NOTE — Progress Notes (Signed)
Jonathan Hodges  MRN: [id number]  DOB/AGE: 10/24/1955 60 y.o.  Primary Care Physician:DIXON,MARY BETH, PA-C  Admit date: 03/15/2016  Chief Complaint:  Chief Complaint  Patient presents with  . Abnormal Lab  . Weakness    S-Pt presented on  03/15/2016 with  Chief Complaint  Patient presents with  . Abnormal Lab  . Weakness  .    Pt is lethargic, does not offer any complaints    meds  . cefTRIAXone (ROCEPHIN)  IV  2 g Intravenous Q24H  . feeding supplement (PRO-STAT SUGAR FREE 64)  30 mL Oral QID  . folic acid  1 mg Oral Daily  . LORazepam  0-4 mg Intravenous Q12H  . multivitamin with minerals  1 tablet Oral Daily  . sodium chloride flush  3 mL Intravenous Q12H  . thiamine  100 mg Oral Daily   Or  . thiamine  100 mg Intravenous Daily      Physical Exam: Vital signs in last 24 hours: Temp:  [96.8 F (36 C)-98.1 F (36.7 C)] 97.9 F (36.6 C) (10/14 1138) Pulse Rate:  [100-110] 102 (10/14 1200) Resp:  [20-39] 21 (10/14 1138) BP: (91-112)/(57-73) 107/69 (10/14 1100) SpO2:  [91 %-97 %] 95 % (10/14 1138) Weight:  [191 lb 5.8 oz (86.8 kg)] 191 lb 5.8 oz (86.8 kg) (10/14 0500) Weight change: 16 lb 5.8 oz (7.421 kg) Last BM Date: 03/16/16 (smear on pad)  Intake/Output from previous day: 10/13 0701 - 10/14 0700 In: 50 [IV Piggyback:50] Out: 1400 [Urine:1400] No intake/output data recorded.   Physical Exam: General- pt is lethargic Resp- No acute REsp distress, decreased at bases CVS- S1S2 regular in rate and rhythm GIT- BS+, distended EXT- 1+ LE Edema, NO Cyanosis    Lab Results: CBC  Recent Labs  03/16/16 0324 03/17/16 0431  WBC 23.0* 19.4*  HGB 7.3* 7.0*  HCT 20.6* 20.4*  PLT 203 188    BMET  Recent Labs  03/17/16 0431 03/17/16 0949  NA 116* 115*  K 4.4 4.3  CL 85* 85*  CO2 22 21*  GLUCOSE 73 78  BUN 15 15  CREATININE 0.70 0.67  CALCIUM 7.1* 7.0*   Sodium 109==> 113=>115    MICRO Recent Results (from the past 240 hour(s))   Culture, body fluid-bottle     Status: None (Preliminary result)   Collection Time: 03/15/16  2:49 PM  Result Value Ref Range Status   Specimen Description FLUID ASCITIC COLLECTED BY DOCTOR WATTS  Final   Special Requests BOTTLES DRAWN AEROBIC AND ANAEROBIC 10CC EACH  Final   Culture NO GROWTH 2 DAYS  Final   Report Status PENDING  Incomplete  Gram stain     Status: None   Collection Time: 03/15/16  2:49 PM  Result Value Ref Range Status   Specimen Description FLUID ASCITIC COLLECTED BY DOCTOR WATTS  Final   Special Requests NONE  Final   Gram Stain   Final    ABUNDANT WBC PRESENT, PREDOMINANTLY PMN NO ORGANISMS SEEN Performed at [facility]    Report Status 03/15/2016 FINAL  Final  MRSA PCR Screening     Status: None   Collection Time: 03/15/16  3:47 PM  Result Value Ref Range Status   MRSA by PCR NEGATIVE NEGATIVE Final    Comment:        The GeneXpert MRSA Assay (FDA approved for NASAL specimens only), is one component of a comprehensive MRSA colonization surveillance program. It is not intended to diagnose MRSA infection nor  to guide or monitor treatment for MRSA infections.       Lab Results  Component Value Date   CALCIUM 7.0 (L) 03/17/2016               Impression: 1)Hyponatremia-           Hypervolemic hyponatremia            Sec to Cirrhosis              Urine na less than 10              Did not respond to IVF             Responding to fluid restriction                       2)CVS- Hypotensive   3)Anemia HGb low Oncological -Metastatic Cancer received PRBC   4)Hyperkalemia             now better   5)Psych- hx of Alc abuse  Primary MD following  6)Liver- Cirrhosis            LFT's high              Sec to Alc abuse and Metastasis   7)Acid base Co2 not  at goal    Lactic acidosis  8) Endo ? Adrenal deficiency        Low Na        High K        Low Bicarb        Adrenal mass   9) ID -admitted  with High WBC       On IV abx    10) Social-Pt with poor prognosis sec to metastatic cancer    Primary team following closely with family .  Plan:   Will ask for free water restriction Will follow bmet      Antelope S 03/17/2016, 1:03 PM

## 2016-03-18 DIAGNOSIS — E43 Unspecified severe protein-calorie malnutrition: Secondary | ICD-10-CM | POA: Insufficient documentation

## 2016-03-18 LAB — COMPREHENSIVE METABOLIC PANEL
ALK PHOS: 335 U/L — AB (ref 38–126)
ALT: 131 U/L — AB (ref 17–63)
AST: 306 U/L — AB (ref 15–41)
Albumin: 1.6 g/dL — ABNORMAL LOW (ref 3.5–5.0)
Anion gap: 10 (ref 5–15)
BUN: 14 mg/dL (ref 6–20)
CALCIUM: 7.2 mg/dL — AB (ref 8.9–10.3)
CO2: 20 mmol/L — AB (ref 22–32)
CREATININE: 0.64 mg/dL (ref 0.61–1.24)
Chloride: 87 mmol/L — ABNORMAL LOW (ref 101–111)
GFR calc non Af Amer: >60 mL/min (ref >60)
Glucose, Bld: 73 mg/dL (ref 65–99)
Potassium: 4.6 mmol/L (ref 3.5–5.1)
SODIUM: 117 mmol/L — AB (ref 135–145)
Total Bilirubin: 2.7 mg/dL — ABNORMAL HIGH (ref 0.3–1.2)
Total Protein: 5.8 g/dL — ABNORMAL LOW (ref 6.5–8.1)

## 2016-03-18 LAB — CBC
HCT: 22.4 % — ABNORMAL LOW (ref 39.0–52.0)
Hemoglobin: 7.4 g/dL — ABNORMAL LOW (ref 13.0–17.0)
MCH: 31.8 pg (ref 26.0–34.0)
MCHC: 33 g/dL (ref 30.0–36.0)
MCV: 96.1 fL (ref 78.0–100.0)
PLATELETS: 205 10*3/uL (ref 150–400)
RBC: 2.33 MIL/uL — AB (ref 4.22–5.81)
RDW: 19.1 % — AB (ref 11.5–15.5)
WBC: 20.4 10*3/uL — ABNORMAL HIGH (ref 4.0–10.5)

## 2016-03-18 MED ORDER — LORAZEPAM 2 MG/ML IJ SOLN
1.0000 mg | INTRAMUSCULAR | Status: DC | PRN
  Administered 2016-03-18 (×2): 1 mg via INTRAVENOUS
  Administered 2016-03-19: 2 mg via INTRAVENOUS
  Administered 2016-03-19: 1 mg via INTRAVENOUS
  Filled 2016-03-18 (×4): qty 1

## 2016-03-18 MED ORDER — MORPHINE SULFATE (PF) 2 MG/ML IV SOLN
2.0000 mg | INTRAVENOUS | Status: DC | PRN
  Administered 2016-03-18 (×2): 2 mg via INTRAVENOUS
  Filled 2016-03-18 (×2): qty 1

## 2016-03-18 MED ORDER — SCOPOLAMINE 1 MG/3DAYS TD PT72
1.0000 | MEDICATED_PATCH | TRANSDERMAL | Status: DC
  Administered 2016-03-18: 1.5 mg via TRANSDERMAL
  Filled 2016-03-18 (×2): qty 1

## 2016-03-18 NOTE — Progress Notes (Signed)
PROGRESS NOTE    Jonathan Hodges  [id number] DOB: Dec 01, 1955 DOA: 03/15/2016 PCP: Karis Juba, PA-C    Brief Narrative:  60 y.o. male with medical history significant of chronic alcohol abuse, depression, hyperlipidemia, hypertension who initially followed up with his primary care provider one day prior to hospital admission for routine follow-up. Routine blood work was obtained at that time. On the day of hospital admission, patient was instructed to present to the hospital after being found to have a critical low sodium and low hemoglobin. On further questioning, patient reports increased weakness over the past several weeks prior to this hospital admission. In addition, patient also noted abdominal distention that started 2 weeks ago. Patient denies chest pain or shortness of breath. Patient denies lower extremity edema. Patient also also reports drinking a sixpack of beer on a daily basis for several years. Last alcohol intake was on the morning of hospital admission  Assessment & Plan:   Principal Problem:   Hyponatremia Active Problems:   Hyperlipidemia   Hypertension   GAD (generalized anxiety disorder)   Alcohol abuse, daily use   Alcoholic cirrhosis of liver with ascites (HCC)   Malignant neoplasm of liver (HCC)   Acute blood loss anemia   Metastatic cancer (HCC)   Ascites   Lactic acidemia   Malignant ascites   Symptomatic anemia   Weakness   Dehydration   Protein-calorie malnutrition, severe   1. Widespread metastatic disease of uncertain primary 1. Patient noted to have widespread metastatic disease on imaging. Masses palpable on physical exam through abdomen 2. Overall prognosis is grim 3. See progress note from 03/17/2016. Briefly, had discussed case with Dr. Whitney Muse who states patient will likely not tolerate aggressive cancer treatment and that overall prognosis will likely be less than 2 weeks. Dr. Whitney Muse has endorsed hospice care 4. Ascitic cytology  remains pending at this time 2. Hyponatremia 1. Sodium this morning noted to be 117. Labs reviewed. 2. See below. Family has agreed to transitioning to full comfort. 3. No further blood work will be obtained. 4. I have called and updated nephrology, who has been assisting with management of hyponatremia 3. Ascites, possible SBP versus malignant ascites 1. We'll discontinue Rocephin 2. Fluid cultures have thus far demonstrated no bacterial growth 3. Likely malignant effusion 4. Hypertension 1. Blood pressure had remained stable thus far 2. Will limit vital signs as patient is now full comfort 5. Hyperlipidemia 1. Reportedly on statin prior to admission, discontinued as patient is on full comfort 6. Alcoholic cirrhosis 1. LFTs had shown gradual improvement, however remain elevated 2. Per below, will hold further blood work as patient is now comfort care only 7. Acute blood loss anemia 1. Hemoglobin had remained stable overnight 2. Patient is not a candidate for endoscopy given acuity of his illness 3. Discussed case with gastroenterology today. Plans for comfort care only 8. Anxiety/depression 1. Will continue Ativan as needed 2. Plan to focus on comfort 9. Continued Alcohol abuse, withdrawals 1. Overnight CIWA score noted to be 21 2. Continue Ativan liberally for anxiety as well as concurrent alcohol withdrawals 10. End-of-life 1. See previous documentation. 2. All family members are present in room. All are in agreement for focus on comfort care only 3. Medications not related to comfort have been discontinued, including antibiotics. 4. Morphine when necessary has been ordered. Additional Ativan has been ordered. 5. Patient noted to have coarse breath sounds at the time patient was seen. Scopolamine patch has been ordered 6. I have consulted  and discussed case with social work for hospice placement. Family is expressing interest in residential hospice.    DVT prophylaxis: SCDs  discontinued, comfort care only Code Status: DO NOT RESUSCITATE, full comfort care Family Communication: Updated all family members in room Disposition Plan: Anticipate discharge to residential hospice when bed available  Consultants:   Nephrology  Gastroenterology  Discussed case with oncology on call (Dr. Whitney Muse)  Procedures:   Ultrasound-guided paracentesis on 03/15/2016  Antimicrobials: Anti-infectives    Start     Dose/Rate Route Frequency Ordered Stop   03/16/16 0830  cefTRIAXone (ROCEPHIN) 2 g in dextrose 5 % 50 mL IVPB  Status:  Discontinued     2 g 100 mL/hr over 30 Minutes Intravenous Every 24 hours 03/16/16 0820 03/18/16 0932   03/15/16 1200  vancomycin (VANCOCIN) 1,500 mg in sodium chloride 0.9 % 500 mL IVPB     1,500 mg 250 mL/hr over 120 Minutes Intravenous  Once 03/15/16 1145 03/15/16 1532   03/15/16 1145  vancomycin (VANCOCIN) 1,250 mg in sodium chloride 0.9 % 250 mL IVPB  Status:  Discontinued     1,250 mg 166.7 mL/hr over 90 Minutes Intravenous  Once 03/15/16 1137 03/15/16 1145   03/15/16 1145  ceFEPIme (MAXIPIME) 2 g in dextrose 5 % 50 mL IVPB     2 g 100 mL/hr over 30 Minutes Intravenous  Once 03/15/16 1137 03/15/16 1236      Subjective: Patient is sedated, cannot obtain  Objective: Vitals:   03/17/16 2000 03/18/16 0515 03/18/16 0746 03/18/16 1139  BP:    107/72  Pulse:   (!) 116 (!) 107  Resp:   (!) 23 (!) 23  Temp: 97.6 F (36.4 C) 98.1 F (36.7 C) 98.3 F (36.8 C) 97.6 F (36.4 C)  TempSrc: Axillary Axillary Axillary Axillary  SpO2:   94% 98%  Weight:  88.8 kg (195 lb 12.3 oz)    Height:        Intake/Output Summary (Last 24 hours) at 03/18/16 1443 Last data filed at 03/18/16 1211  Gross per 24 hour  Intake                0 ml  Output              950 ml  Net             -950 ml   Filed Weights   03/16/16 0400 03/17/16 0500 03/18/16 0515  Weight: 79.4 kg (175 lb 1.4 oz) 86.8 kg (191 lb 5.8 oz) 88.8 kg (195 lb 12.3 oz)     Examination:  General exam: Patient lethargic, lying in bed, occasionally grimacing Respiratory system: Mildly increased respiratory effort, no audible wheezing Cardiovascular system: Tachycardic, S1-S2 Gastrointestinal system: Masses palpated through the abdomen, positive bowel sounds, more distended today Central nervous system: No active tremors, no active seizures Extremities: No clubbing, perfused Skin: Normal skin turgor, jaundiced, no notable skin lesions seen Psychiatry: Unable to assess secondary to patient's lethargy.  Data Reviewed: I have personally reviewed following labs and imaging studies  CBC:  Recent Labs Lab 03/14/16 0843 03/15/16 1041 03/16/16 0324 03/17/16 0431 03/18/16 0456  WBC 23.1* 29.3* 23.0* 19.4* 20.4*  NEUTROABS 20,328* 25.4*  --   --   --   HGB 6.2* 5.6* 7.3* 7.0* 7.4*  HCT 19.1* 16.6* 20.6* 20.4* 22.4*  MCV 98.5 96.5 91.2 94.0 96.1  PLT 339 315 203 188 [id number]   Basic Metabolic Panel:  Recent Labs Lab 03/16/16 1530 03/16/16 2142  03/17/16 0431 03/17/16 0949 03/18/16 0456  NA 114* 115* 116* 115* 117*  K 4.7 4.5 4.4 4.3 4.6  CL 84* 85* 85* 85* 87*  CO2 21* 22 22 21* 20*  GLUCOSE 75 72 73 78 73  BUN 15 15 15 15 14   CREATININE 0.70 0.75 0.70 0.67 0.64  CALCIUM 7.0* 7.0* 7.1* 7.0* 7.2*   GFR: Estimated Creatinine Clearance: 110.1 mL/min (by C-G formula based on SCr of 0.64 mg/dL). Liver Function Tests:  Recent Labs Lab 03/14/16 0843 03/15/16 1041 03/16/16 0324 03/17/16 0431 03/18/16 0456  AST 511* 570* 493* 375* 306*  ALT 169* 201* 174* 147* 131*  ALKPHOS 373* 322* 297* 272* 335*  BILITOT 2.5* 3.3* 3.9* 2.7* 2.7*  PROT 5.8* 6.1* 5.6* 5.4* 5.8*  ALBUMIN 2.0* 1.8* 1.7* 1.6* 1.6*   No results for input(s): LIPASE, AMYLASE in the last 168 hours.  Recent Labs Lab 03/15/16 1041  AMMONIA 22   Coagulation Profile:  Recent Labs Lab 03/15/16 1041  INR 1.43   Cardiac Enzymes: No results for input(s): CKTOTAL, CKMB,  CKMBINDEX, TROPONINI in the last 168 hours. BNP (last 3 results) No results for input(s): PROBNP in the last 8760 hours. HbA1C: No results for input(s): HGBA1C in the last 72 hours. CBG: No results for input(s): GLUCAP in the last 168 hours. Lipid Profile: No results for input(s): CHOL, HDL, LDLCALC, TRIG, CHOLHDL, LDLDIRECT in the last 72 hours. Thyroid Function Tests: No results for input(s): TSH, T4TOTAL, FREET4, T3FREE, THYROIDAB in the last 72 hours. Anemia Panel: No results for input(s): VITAMINB12, FOLATE, FERRITIN, TIBC, IRON, RETICCTPCT in the last 72 hours. Sepsis Labs:  Recent Labs Lab 03/15/16 1041 03/15/16 1524 03/16/16 0324  LATICACIDVEN 6.1* 2.6* 1.2    Recent Results (from the past 240 hour(s))  Culture, body fluid-bottle     Status: None (Preliminary result)   Collection Time: 03/15/16  2:49 PM  Result Value Ref Range Status   Specimen Description FLUID ASCITIC COLLECTED BY DOCTOR WATTS  Final   Special Requests BOTTLES DRAWN AEROBIC AND ANAEROBIC 10CC EACH  Final   Culture NO GROWTH 3 DAYS  Final   Report Status PENDING  Incomplete  Gram stain     Status: None   Collection Time: 03/15/16  2:49 PM  Result Value Ref Range Status   Specimen Description FLUID ASCITIC COLLECTED BY DOCTOR WATTS  Final   Special Requests NONE  Final   Gram Stain   Final    ABUNDANT WBC PRESENT, PREDOMINANTLY PMN NO ORGANISMS SEEN Performed at [facility]    Report Status 03/15/2016 FINAL  Final  MRSA PCR Screening     Status: None   Collection Time: 03/15/16  3:47 PM  Result Value Ref Range Status   MRSA by PCR NEGATIVE NEGATIVE Final    Comment:        The GeneXpert MRSA Assay (FDA approved for NASAL specimens only), is one component of a comprehensive MRSA colonization surveillance program. It is not intended to diagnose MRSA infection nor to guide or monitor treatment for MRSA infections.      Radiology Studies: No results found.  Scheduled Meds: .  chlorhexidine  15 mL Mouth Rinse BID  . scopolamine  1 patch Transdermal Q72H  . sodium chloride flush  3 mL Intravenous Q12H   Continuous Infusions:    LOS: 3 days   Ivon Oelkers, Orpah Melter, MD [facility] Pager [phone]  If 7PM-7AM, please contact night-coverage [email or web address] Password [email or web address] 03/18/2016, 2:43 PM

## 2016-03-18 NOTE — Progress Notes (Addendum)
Jonathan Hodges  MRN: [id number]  DOB/AGE: [birth year] 60 y.o.  Primary Care Physician:DIXON,MARY BETH, PA-C  Admit date: 03/15/2016  Chief Complaint:  Chief Complaint  Patient presents with  . Abnormal Lab  . Weakness    S-Pt presented on  03/15/2016 with  Chief Complaint  Patient presents with  . Abnormal Lab  . Weakness  .    Pt is lethargic, does not offer any complaints    meds  . cefTRIAXone (ROCEPHIN)  IV  2 g Intravenous Q24H  . chlorhexidine  15 mL Mouth Rinse BID  . famotidine (PEPCID) IV  20 mg Intravenous Q12H  . feeding supplement (PRO-STAT SUGAR FREE 64)  30 mL Oral QID  . folic acid  1 mg Oral Daily  . LORazepam  0-4 mg Intravenous Q12H  . mouth rinse  15 mL Mouth Rinse q12n4p  . multivitamin with minerals  1 tablet Oral Daily  . sodium chloride flush  3 mL Intravenous Q12H  . thiamine  100 mg Oral Daily   Or  . thiamine  100 mg Intravenous Daily      Physical Exam: Vital signs in last 24 hours: Temp:  [97.4 F (36.3 C)-98.1 F (36.7 C)] 98.1 F (36.7 C) (10/15 0515) Pulse Rate:  [94-112] 109 (10/14 1815) Resp:  [19-40] 27 (10/14 1815) BP: (91-115)/(60-77) 97/77 (10/14 1800) SpO2:  [92 %-99 %] 98 % (10/14 1815) Weight:  [195 lb 12.3 oz (88.8 kg)] 195 lb 12.3 oz (88.8 kg) (10/15 0515) Weight change: 4 lb 6.6 oz (2 kg) Last BM Date: 03/17/16  Intake/Output from previous day: 10/14 0701 - 10/15 0700 In: 0  Out: 950 [Urine:950] No intake/output data recorded.   Physical Exam: General- pt is lethargic Resp- No acute REsp distress, decreased at bases CVS- S1S2 regular in rate and rhythm GIT- BS+, distended EXT- 1+ LE Edema, NO Cyanosis    Lab Results: CBC  Recent Labs  03/17/16 0431 03/18/16 0456  WBC 19.4* 20.4*  HGB 7.0* 7.4*  HCT 20.4* 22.4*  PLT 188 205    BMET  Recent Labs  03/17/16 0949 03/18/16 0456  NA 115* 117*  K 4.3 4.6  CL 85* 87*  CO2 21* 20*  GLUCOSE 78 73  BUN 15 14  CREATININE 0.67 0.64  CALCIUM  7.0* 7.2*   Sodium 109==> 113=>115==>117    MICRO Recent Results (from the past 240 hour(s))  Culture, body fluid-bottle     Status: None (Preliminary result)   Collection Time: 03/15/16  2:49 PM  Result Value Ref Range Status   Specimen Description FLUID ASCITIC COLLECTED BY DOCTOR WATTS  Final   Special Requests BOTTLES DRAWN AEROBIC AND ANAEROBIC 10CC EACH  Final   Culture NO GROWTH 2 DAYS  Final   Report Status PENDING  Incomplete  Gram stain     Status: None   Collection Time: 03/15/16  2:49 PM  Result Value Ref Range Status   Specimen Description FLUID ASCITIC COLLECTED BY DOCTOR WATTS  Final   Special Requests NONE  Final   Gram Stain   Final    ABUNDANT WBC PRESENT, PREDOMINANTLY PMN NO ORGANISMS SEEN Performed at [facility]    Report Status 03/15/2016 FINAL  Final  MRSA PCR Screening     Status: None   Collection Time: 03/15/16  3:47 PM  Result Value Ref Range Status   MRSA by PCR NEGATIVE NEGATIVE Final    Comment:        The GeneXpert MRSA Assay (  FDA approved for NASAL specimens only), is one component of a comprehensive MRSA colonization surveillance program. It is not intended to diagnose MRSA infection nor to guide or monitor treatment for MRSA infections.       Lab Results  Component Value Date   CALCIUM 7.2 (L) 03/18/2016               Impression: 1)Hyponatremia-           Hypervolemic hyponatremia            Sec to Cirrhosis              Urine na less than 10              Did not respond to IVF             Responding to fluid restriction                       2)CVS- Hypotensive   3)Anemia HGb low Oncological -Metastatic Cancer received PRBC   4)Hyperkalemia             now better   5)Psych- hx of Alc abuse  Primary MD following  6)Liver- Cirrhosis            LFT's high              Sec to Alc abuse and Metastasis   7)Social-Pt with poor prognosis sec to metastatic cancer    Primary team  following closely with family .    Pt now under Hospice care  Plan:   Will sign off as pt under hospice. Please call if any questions. Thanks for allowing [place] to participate in pt care.       Billyjack Trompeter S 03/18/2016, 7:02 AM

## 2016-03-18 NOTE — Progress Notes (Addendum)
CM left message for return call from [facility].  Spoke with family regarding services.  Family information for contacting: Evelena Peat (daughter) [phone].  Almyra Free (daughter) [phone]  Jari Pigg (mother) [phone] Spoke with daughter Evelena Peat regarding following up with [employer] about the patient's medicaid.    Faxed information to Hospice for review.  Spoke with the nurse on call and will contact if able to come to hospital for assessment for Hospice services. Family prefers for patient to be admitted into a facility for Hospice and not receive services at home.

## 2016-03-18 NOTE — Treatment Plan (Signed)
Patient seen. Full note will follow. Met with family at bedside. Patient currently lethargic and unable to participate in meeting. Family agrees that further efforts are only delaying the inevitable and they are in agreement with full comfort care. Family is interested in speaking with Hospice for possible transition to residential hospice if possible. Will d/c non-comfort care medications. Will consult social work to assist with Hospice referral. Will add IV morphine for air hunger and pain control.

## 2016-03-18 NOTE — Progress Notes (Signed)
Pt resting comfortably at this time. Will continue to monitor patient throughout the shift.  

## 2016-03-19 MED ORDER — CHLORHEXIDINE GLUCONATE 0.12 % MT SOLN: 15.0000 mL | Freq: Two times a day (BID) | OROMUCOSAL | 0 refills | Status: AC

## 2016-03-19 MED ORDER — SCOPOLAMINE 1 MG/3DAYS TD PT72: 1.0000 | MEDICATED_PATCH | TRANSDERMAL | 12 refills | Status: AC

## 2016-03-19 MED ORDER — MORPHINE SULFATE (PF) 2 MG/ML IV SOLN: 2.0000 mg | INTRAVENOUS | 0 refills | Status: AC | PRN

## 2016-03-19 MED ORDER — LORAZEPAM 2 MG/ML IJ SOLN: 1.0000 mg | INTRAMUSCULAR | 0 refills | Status: AC | PRN

## 2016-03-19 NOTE — Discharge Summary (Signed)
Physician Discharge Summary  Jonathan Hodges [id number] DOB: 01/21/1956 DOA: 03/15/2016  PCP: Karis Juba, PA-C  Admit date: 03/15/2016 Discharge date: 03/19/2016  Admitted From: Home Disposition:  Residential Hospice  Recommendations for Outpatient Follow-up:  1. Follow up on as needed basis  Discharge Condition:Stable CODE STATUS:DNR Diet recommendation: comfort only   Brief/Interim Summary:  1. Widespread metastatic disease of uncertain primary 1. Patient noted to have widespread metastatic disease on imaging. Masses palpable on physical exam through abdomen 2. Overall prognosis is grim 3. See progress note from 03/17/2016. I had discussed the case with Dr. Whitney Muse who states patient will likely not tolerate aggressive cancer treatment and that overall prognosis will likely be less than 2 weeks. Dr. Whitney Muse has endorsed hospice care 4. Ascitic cytology remains pending at this time 5. Patient is briefly arousable and speaks in 2-3 word sentences. However, patient is not able to demonstrate meaningful insight to his care or orientation. Therefore, patient is unable to make medical decisions. 2. Hyponatremia 1. Most recent sodium noted to be 117, up from 109 on admission 2. Appreciate assistance by Nephrology 3. Comfort measures 3. Ascites, possible SBP versus malignant ascites 1. We'll discontinue Rocephin 2. Fluid cultures have thus far demonstrated no bacterial growth 3. Likely malignant effusion 4. Hypertension 1. Blood pressure had remained stable thus far 2. Will limit vital signs as patient is now full comfort 5. Hyperlipidemia 1. Reportedly on statin prior to admission, discontinued as patient is on full comfort 6. Alcoholic cirrhosis 1. LFTs had shown gradual improvement, however remain elevated 2. Per below, will hold further blood work as patient is now comfort care only 7. Acute blood loss anemia 1. Hemoglobin had remained stable overnight 2. Patient is not  a candidate for endoscopy given acuity of his illness 3. Discussed case with gastroenterology today. Plans for comfort care only 8. Anxiety/depression 1. Will continue Ativan as needed 2. Plan to focus on comfort 9. Continued Alcohol abuse, withdrawals 1. Continue Ativan liberally for anxiety as well as concurrent alcohol withdrawals 10. End-of-life 1. See previous documentation. 2. Family present. All are in agreement for focus on comfort care only. Patient currently cannot demonstrate orientation or ability to have insight to care. 3. Medications not related to comfort have been discontinued, including antibiotics. 4. Morphine when necessary has been ordered. Additional Ativan has been ordered. 5. Plan transfer to residential hospice services  Discharge Diagnoses:  Principal Problem:   Hyponatremia Active Problems:   Hyperlipidemia   Hypertension   GAD (generalized anxiety disorder)   Alcohol abuse, daily use   Alcoholic cirrhosis of liver with ascites (HCC)   Malignant neoplasm of liver (HCC)   Acute blood loss anemia   Metastatic cancer (HCC)   Ascites   Lactic acidemia   Malignant ascites   Symptomatic anemia   Weakness   Dehydration   Protein-calorie malnutrition, severe    Discharge Instructions     Medication List    STOP taking these medications   allopurinol 300 MG tablet Commonly known as:  ZYLOPRIM   amLODipine 10 MG tablet Commonly known as:  NORVASC   atenolol 50 MG tablet Commonly known as:  TENORMIN   benazepril 40 MG tablet Commonly known as:  LOTENSIN   buPROPion 150 MG 12 hr tablet Commonly known as:  WELLBUTRIN SR   DULoxetine 30 MG capsule Commonly known as:  CYMBALTA   DULoxetine 60 MG capsule Commonly known as:  CYMBALTA   naproxen sodium 220 MG tablet Commonly known as:  ANAPROX   sildenafil 100 MG tablet Commonly known as:  VIAGRA     TAKE these medications   chlorhexidine 0.12 % solution Commonly known as:  PERIDEX 15  mLs by Mouth Rinse route 2 (two) times daily.   LORazepam 2 MG/ML injection Commonly known as:  ATIVAN Inject 0.5 mLs (1 mg total) into the vein every 4 (four) hours as needed for anxiety or seizure.   morphine 2 MG/ML injection Inject 1 mL (2 mg total) into the vein every hour as needed (or sob for comfort).   scopolamine 1 MG/3DAYS Commonly known as:  TRANSDERM-SCOP Place 1 patch (1.5 mg total) onto the skin every 3 (three) days. Start taking on:  03/21/2016      Follow-up Information    DIXON,MARY BETH, PA-C .   Specialty:  Physician Assistant Why:  follow up on as needed basis Contact information: [address] [place]  [address] [phone]          Allergies  Allergen Reactions  . Celexa [Citalopram Hydrobromide]     "Moody", "Change in personality", "Agitated"  . Zoloft [Sertraline Hcl]     "Just didn't feel right" "confused, couldn't think clearly"    Consultations:  Nephrology  GI  Procedures/Studies: Dg Chest 2 View  Result Date: 03/15/2016 CLINICAL DATA:  Weakness for 10 days. EXAM: CHEST  2 VIEW COMPARISON:  Radiographs of December 26, 2014. FINDINGS: The heart size and mediastinal contours are within normal limits. Atherosclerosis of thoracic aorta is noted. No pneumothorax or pleural effusion is noted. Rounded density is seen in left midlung, with another rounded density seen in right lower lobe ; these are concerning for metastatic lesions. Smaller nodule is noted in right upper lobe. The visualized skeletal structures are unremarkable. IMPRESSION: Bilateral pulmonary nodules are noted concerning for metastatic disease. CT scan of the chest is recommended for further evaluation. Aortic atherosclerosis. Electronically Signed   By: Marijo Conception, M.D.   On: 03/15/2016 11:14   Ct Head Wo Contrast  Result Date: 03/15/2016 CLINICAL DATA:  60 year old male with generalized weakness, confusion and difficulty walking for 2 weeks. EXAM: CT HEAD  WITHOUT CONTRAST TECHNIQUE: Contiguous axial images were obtained from the base of the skull through the vertex without intravenous contrast. COMPARISON:  None. FINDINGS: Brain: No evidence of acute infarction, hemorrhage, hydrocephalus, extra-axial collection or mass lesion/mass effect. Mild atrophy and chronic small-vessel white matter ischemic changes noted. Vascular: No hyperdense vessel or unexpected calcification. Skull: Normal. Negative for fracture or focal lesion. Sinuses/Orbits: No acute finding. Other: None. IMPRESSION: No evidence of acute intracranial abnormality. Mild atrophy and chronic small-vessel white matter ischemic changes. Electronically Signed   By: Margarette Canada M.D.   On: 03/15/2016 12:27   Ct Chest W Contrast  Result Date: 03/15/2016 CLINICAL DATA:  Anemia and hyponatremia. EXAM: CT CHEST, ABDOMEN, AND PELVIS WITH CONTRAST TECHNIQUE: Multidetector CT imaging of the chest, abdomen and pelvis was performed following the standard protocol during bolus administration of intravenous contrast. CONTRAST:  13mL ISOVUE-300 IOPAMIDOL (ISOVUE-300) INJECTION 61% COMPARISON:  None. FINDINGS: CT CHEST FINDINGS Cardiovascular: No cardiomegaly or pericardial effusion. Diffuse atherosclerotic calcification, including the coronary arteries. Mediastinum/Nodes: Diffuse thickening of the esophagus with luminal irregularity. The thickening does not enhance typical of varices. Surprising absence of mediastinal adenopathy. Lungs/Pleura: Innumerable lobulated discrete nodules consistent with metastatic disease, measuring up to 27 mm at the right base. No effusion or pneumothorax. No superimposed pneumonia or edema. Musculoskeletal: No acute or aggressive finding CT ABDOMEN  PELVIS FINDINGS Hepatobiliary: Cirrhotic liver morphology with recannulized umbilical vein and innumerable bulky masses. A segment 4B mass extends through the capsule. No evidence of biliary obstruction or stone. Pancreas: Unremarkable.  Spleen: Lobulated mass in the anterior spleen measuring up to 4 cm, presumed metastasis. There are at least 2 other smaller hypo enhancing areas. Adrenals/Urinary Tract: 22 by 16 mm right adrenal mass consistent with metastasis in this setting. No hydronephrosis or stone. Unremarkable bladder. Stomach/Bowel:  No obstruction or mass. No inflammatory findings. Vascular/Lymphatic: Portal hypertension with recannulized periumbilical vein. Adenopathy in the upper abdominal ligaments and pre/periaortic locations consistent with metastases. Reproductive:No pathologic findings. Other: Moderate ascites without generalized peritoneal nodularity. Musculoskeletal: No acute or aggressive finding. IMPRESSION: 1. Widespread metastatic disease present in the lungs, liver, abdominal nodes, right adrenal gland, and spleen. Esophageal adenocarcinoma or hepatocellular carcinoma (background cirrhosis) is the favored primary. 2. Moderate ascites. One of the large left liver masses has extracapsular growth. Electronically Signed   By: Monte Fantasia M.D.   On: 03/15/2016 12:31   Ct Abdomen Pelvis W Contrast  Result Date: 03/15/2016 CLINICAL DATA:  Anemia and hyponatremia. EXAM: CT CHEST, ABDOMEN, AND PELVIS WITH CONTRAST TECHNIQUE: Multidetector CT imaging of the chest, abdomen and pelvis was performed following the standard protocol during bolus administration of intravenous contrast. CONTRAST:  15mL ISOVUE-300 IOPAMIDOL (ISOVUE-300) INJECTION 61% COMPARISON:  None. FINDINGS: CT CHEST FINDINGS Cardiovascular: No cardiomegaly or pericardial effusion. Diffuse atherosclerotic calcification, including the coronary arteries. Mediastinum/Nodes: Diffuse thickening of the esophagus with luminal irregularity. The thickening does not enhance typical of varices. Surprising absence of mediastinal adenopathy. Lungs/Pleura: Innumerable lobulated discrete nodules consistent with metastatic disease, measuring up to 27 mm at the right base. No  effusion or pneumothorax. No superimposed pneumonia or edema. Musculoskeletal: No acute or aggressive finding CT ABDOMEN PELVIS FINDINGS Hepatobiliary: Cirrhotic liver morphology with recannulized umbilical vein and innumerable bulky masses. A segment 4B mass extends through the capsule. No evidence of biliary obstruction or stone. Pancreas: Unremarkable. Spleen: Lobulated mass in the anterior spleen measuring up to 4 cm, presumed metastasis. There are at least 2 other smaller hypo enhancing areas. Adrenals/Urinary Tract: 22 by 16 mm right adrenal mass consistent with metastasis in this setting. No hydronephrosis or stone. Unremarkable bladder. Stomach/Bowel:  No obstruction or mass. No inflammatory findings. Vascular/Lymphatic: Portal hypertension with recannulized periumbilical vein. Adenopathy in the upper abdominal ligaments and pre/periaortic locations consistent with metastases. Reproductive:No pathologic findings. Other: Moderate ascites without generalized peritoneal nodularity. Musculoskeletal: No acute or aggressive finding. IMPRESSION: 1. Widespread metastatic disease present in the lungs, liver, abdominal nodes, right adrenal gland, and spleen. Esophageal adenocarcinoma or hepatocellular carcinoma (background cirrhosis) is the favored primary. 2. Moderate ascites. One of the large left liver masses has extracapsular growth. Electronically Signed   By: Monte Fantasia M.D.   On: 03/15/2016 12:31   US Paracentesis  Result Date: 03/15/2016 INDICATION: Malignant ascites. EXAM: ULTRASOUND GUIDED THERAPEUTIC AND DIAGNOSTIC PARACENTESIS MEDICATIONS: None. COMPLICATIONS: None immediate. PROCEDURE: Exam was described to the patient who was subsequently treated with Ativan for signs of withdrawal. Before the procedure the patient had good understanding of the risks and benefits of the procedure and written informed consent was obtained from the patient. A timeout was performed prior to the initiation of the  procedure. Initial ultrasound scanning demonstrates an adequate amount of ascites within the right lower abdominal quadrant. The right lower abdomen was prepped and draped in the usual sterile fashion. 1% lidocaine was used for local anesthesia. Following this, a 55  gauge, 7-cm, Yueh catheter was introduced. An ultrasound image was saved for documentation purposes. The paracentesis was performed. The catheter was removed and a dressing was applied. The patient tolerated the procedure well without immediate post procedural complication. FINDINGS: A total of approximately 1,100 of blood-tinged and cloudy fluid was removed. Samples were sent to the laboratory and pathology as requested by the clinical team. IMPRESSION: Successful ultrasound-guided paracentesis yielding 1.1 liters of peritoneal fluid. Electronically Signed   By: Monte Fantasia M.D.   On: 03/15/2016 15:27     Subjective: Unable to assess  Discharge Exam: Vitals:   03/18/16 1139 03/19/16 0751  BP: 107/72 98/70  Pulse: (!) 107 (!) 110  Resp: (!) 23 (!) 23  Temp: 97.6 F (36.4 C) 98.1 F (36.7 C)   Vitals:   03/18/16 0515 03/18/16 0746 03/18/16 1139 03/19/16 0751  BP:   107/72 98/70  Pulse:  (!) 116 (!) 107 (!) 110  Resp:  (!) 23 (!) 23 (!) 23  Temp: 98.1 F (36.7 C) 98.3 F (36.8 C) 97.6 F (36.4 C) 98.1 F (36.7 C)  TempSrc: Axillary Axillary Axillary Axillary  SpO2:  94% 98% 100%  Weight: 88.8 kg (195 lb 12.3 oz)     Height:        General: Patient not oriented, not in acute distress Cardiovascular: RRR, S1/S2 +, no rubs, no gallops Respiratory: CTA bilaterally, no wheezing, no rhonchi Abdominal: Soft, NT, ND, bowel sounds + Extremities: no edema, no cyanosis   The results of significant diagnostics from this hospitalization (including imaging, microbiology, ancillary and laboratory) are listed below for reference.     Microbiology: Recent Results (from the past 240 hour(s))  Culture, body fluid-bottle      Status: None (Preliminary result)   Collection Time: 03/15/16  2:49 PM  Result Value Ref Range Status   Specimen Description FLUID ASCITIC COLLECTED BY DOCTOR WATTS  Final   Special Requests BOTTLES DRAWN AEROBIC AND ANAEROBIC 10CC EACH  Final   Culture NO GROWTH 4 DAYS  Final   Report Status PENDING  Incomplete  Gram stain     Status: None   Collection Time: 03/15/16  2:49 PM  Result Value Ref Range Status   Specimen Description FLUID ASCITIC COLLECTED BY DOCTOR WATTS  Final   Special Requests NONE  Final   Gram Stain   Final    ABUNDANT WBC PRESENT, PREDOMINANTLY PMN NO ORGANISMS SEEN Performed at [facility]    Report Status 03/15/2016 FINAL  Final  MRSA PCR Screening     Status: None   Collection Time: 03/15/16  3:47 PM  Result Value Ref Range Status   MRSA by PCR NEGATIVE NEGATIVE Final    Comment:        The GeneXpert MRSA Assay (FDA approved for NASAL specimens only), is one component of a comprehensive MRSA colonization surveillance program. It is not intended to diagnose MRSA infection nor to guide or monitor treatment for MRSA infections.      Labs: BNP (last 3 results) No results for input(s): BNP in the last 8760 hours. Basic Metabolic Panel:  Recent Labs Lab 03/16/16 1530 03/16/16 2142 03/17/16 0431 03/17/16 0949 03/18/16 0456  NA 114* 115* 116* 115* 117*  K 4.7 4.5 4.4 4.3 4.6  CL 84* 85* 85* 85* 87*  CO2 21* 22 22 21* 20*  GLUCOSE 75 72 73 78 73  BUN 15 15 15 15 14   CREATININE 0.70 0.75 0.70 0.67 0.64  CALCIUM 7.0* 7.0*  7.1* 7.0* 7.2*   Liver Function Tests:  Recent Labs Lab 03/14/16 0843 03/15/16 1041 03/16/16 0324 03/17/16 0431 03/18/16 0456  AST 511* 570* 493* 375* 306*  ALT 169* 201* 174* 147* 131*  ALKPHOS 373* 322* 297* 272* 335*  BILITOT 2.5* 3.3* 3.9* 2.7* 2.7*  PROT 5.8* 6.1* 5.6* 5.4* 5.8*  ALBUMIN 2.0* 1.8* 1.7* 1.6* 1.6*   No results for input(s): LIPASE, AMYLASE in the last 168 hours.  Recent Labs Lab  03/15/16 1041  AMMONIA 22   CBC:  Recent Labs Lab 03/14/16 0843 03/15/16 1041 03/16/16 0324 03/17/16 0431 03/18/16 0456  WBC 23.1* 29.3* 23.0* 19.4* 20.4*  NEUTROABS 20,328* 25.4*  --   --   --   HGB 6.2* 5.6* 7.3* 7.0* 7.4*  HCT 19.1* 16.6* 20.6* 20.4* 22.4*  MCV 98.5 96.5 91.2 94.0 96.1  PLT 339 315 203 188 205   Cardiac Enzymes: No results for input(s): CKTOTAL, CKMB, CKMBINDEX, TROPONINI in the last 168 hours. BNP: Invalid input(s): POCBNP CBG: No results for input(s): GLUCAP in the last 168 hours. D-Dimer No results for input(s): DDIMER in the last 72 hours. Hgb A1c No results for input(s): HGBA1C in the last 72 hours. Lipid Profile No results for input(s): CHOL, HDL, LDLCALC, TRIG, CHOLHDL, LDLDIRECT in the last 72 hours. Thyroid function studies No results for input(s): TSH, T4TOTAL, T3FREE, THYROIDAB in the last 72 hours.  Invalid input(s): FREET3 Anemia work up No results for input(s): VITAMINB12, FOLATE, FERRITIN, TIBC, IRON, RETICCTPCT in the last 72 hours. Urinalysis    Component Value Date/Time   COLORURINE AMBER (A) 03/16/2016 0300   APPEARANCEUR CLEAR 03/16/2016 0300   LABSPEC 1.010 03/16/2016 0300   PHURINE 6.0 03/16/2016 0300   GLUCOSEU NEGATIVE 03/16/2016 0300   HGBUR NEGATIVE 03/16/2016 0300   BILIRUBINUR NEGATIVE 03/16/2016 0300   KETONESUR 15 (A) 03/16/2016 0300   PROTEINUR NEGATIVE 03/16/2016 0300   NITRITE NEGATIVE 03/16/2016 0300   LEUKOCYTESUR NEGATIVE 03/16/2016 0300   Sepsis Labs Invalid input(s): PROCALCITONIN,  WBC,  LACTICIDVEN Microbiology Recent Results (from the past 240 hour(s))  Culture, body fluid-bottle     Status: None (Preliminary result)   Collection Time: 03/15/16  2:49 PM  Result Value Ref Range Status   Specimen Description FLUID ASCITIC COLLECTED BY DOCTOR WATTS  Final   Special Requests BOTTLES DRAWN AEROBIC AND ANAEROBIC 10CC EACH  Final   Culture NO GROWTH 4 DAYS  Final   Report Status PENDING  Incomplete   Gram stain     Status: None   Collection Time: 03/15/16  2:49 PM  Result Value Ref Range Status   Specimen Description FLUID ASCITIC COLLECTED BY DOCTOR WATTS  Final   Special Requests NONE  Final   Gram Stain   Final    ABUNDANT WBC PRESENT, PREDOMINANTLY PMN NO ORGANISMS SEEN Performed at [facility]    Report Status 03/15/2016 FINAL  Final  MRSA PCR Screening     Status: None   Collection Time: 03/15/16  3:47 PM  Result Value Ref Range Status   MRSA by PCR NEGATIVE NEGATIVE Final    Comment:        The GeneXpert MRSA Assay (FDA approved for NASAL specimens only), is one component of a comprehensive MRSA colonization surveillance program. It is not intended to diagnose MRSA infection nor to guide or monitor treatment for MRSA infections.      SIGNED:   Donne Hazel, MD  [facility] 03/19/2016, 11:17 AM  If 7PM-7AM, please  contact night-coverage [email or web address] Password [email or web address]

## 2016-03-19 NOTE — Clinical Social Work Note (Signed)
Patient Information   Patient Name Jonathan Hodges, Jonathan Hodges ([id number]) Sex Male DOB 10-30-1955 SSN: [id number]  [place] [id number]  Patient Demographics   Address [id number] [place] [address] Phone [phone] (Home) [phone] (Mobile)  Patient Ethnicity & Race   Ethnic Group Patient Race  Not Hispanic or Latino White or Caucasian  Emergency Contact(s)   Name Relation Home Work Mobile  [place],Bea Mother [phone]  [phone]  Dondra Prader   [phone]  Gumbs,Jessie Daughter   [phone]  Documents on File    Status Date Received Description  Documents for the Patient  [place] E-Signature HIPAA Notice of Privacy Received 09/05/12   [facility] E-Signature HIPAA Notice of Privacy Spanish Received 08/15/12   [place] HIPAA NOTICE OF PRIVACY - Scanned Not Received    Driver's License Not Received    Insurance Card Received 12/18/12 07./17/14  Advance Directives/Living Will/HCPOA/POA Not Received    Advanced Beneficiary Notice (ABN) Not Received    Driver's License Received [id number]  Insurance Card Not Received    E-Signature AOB Spanish Not Received    Insurance Card Received 06/10/14 BCBS  Other Photo ID Not Received    Release of Information Not Received    Documents for the Encounter  AOB (Assignment of Insurance Benefits) Not Received    E-signature AOB Signed 03/15/16   MEDICARE RIGHTS Not Received    E-signature Medicare Rights     ED Patient Billing Extract   ED PB Summary  ED Patient Billing Extract   ED Encounter Summary  Cardiac Monitoring Strip  03/15/16   Consent Form  03/16/16   EKG  03/16/16   BLOOD TRANSFUSION  03/16/16   Admission Information   Attending Provider Admitting Provider Admission Type Admission Date/Time  Donne Hazel, MD Donne Hazel, MD Emergency 03/15/16 1015  Discharge Date Hospital Service Auth/Cert Status Service Area   Internal Medicine Incomplete [place]  Unit Room/Bed  Admission Status   AP-DEPT 300 A321/A321-01 Admission (Confirmed)   Admission   Complaint  abnormal labs  Hospital Account   Name Acct ID Class Status Primary Coverage  Jonathan Hodges, Jonathan Hodges [id number] Inpatient Open MEDICAID POTENTIAL - MEDICAID POTENTIAL      Guarantor Account (for [place] [id number])   Name Relation to [place]? Acct Type  Jonathan Hodges Self [facility]SA Yes Personal/Family  Address Phone    [address] [place], [place] [address] [phone])        Coverage Information (for Hospital Account [id number])   F/O Payor/Plan Precert #  MEDICAID POTENTIAL/MEDICAID POTENTIAL   Subscriber Subscriber #  Corinna Capra   Address Phone  [address] [place],  [address] [phone]

## 2016-03-20 LAB — CULTURE, BODY FLUID W GRAM STAIN -BOTTLE

## 2016-03-20 LAB — CULTURE, BODY FLUID-BOTTLE: CULTURE: NO GROWTH

## 2016-03-21 ENCOUNTER — Ambulatory Visit: Payer: Self-pay | Admitting: Physician Assistant

## 2016-04-04 DEATH — deceased

## 2016-07-06 ENCOUNTER — Encounter: Payer: Self-pay | Admitting: Physician Assistant

## 2016-08-06 ENCOUNTER — Encounter: Payer: Self-pay | Admitting: Physician Assistant

## 2018-01-04 IMAGING — DX DG CHEST 2V
2 series · 2 of 2 positions shown · non-contrast
Comparison: Radiographs December 26, 2014.

CLINICAL DATA: Weakness for 10 days.

EXAM:
CHEST  2 VIEW

[chest lat]
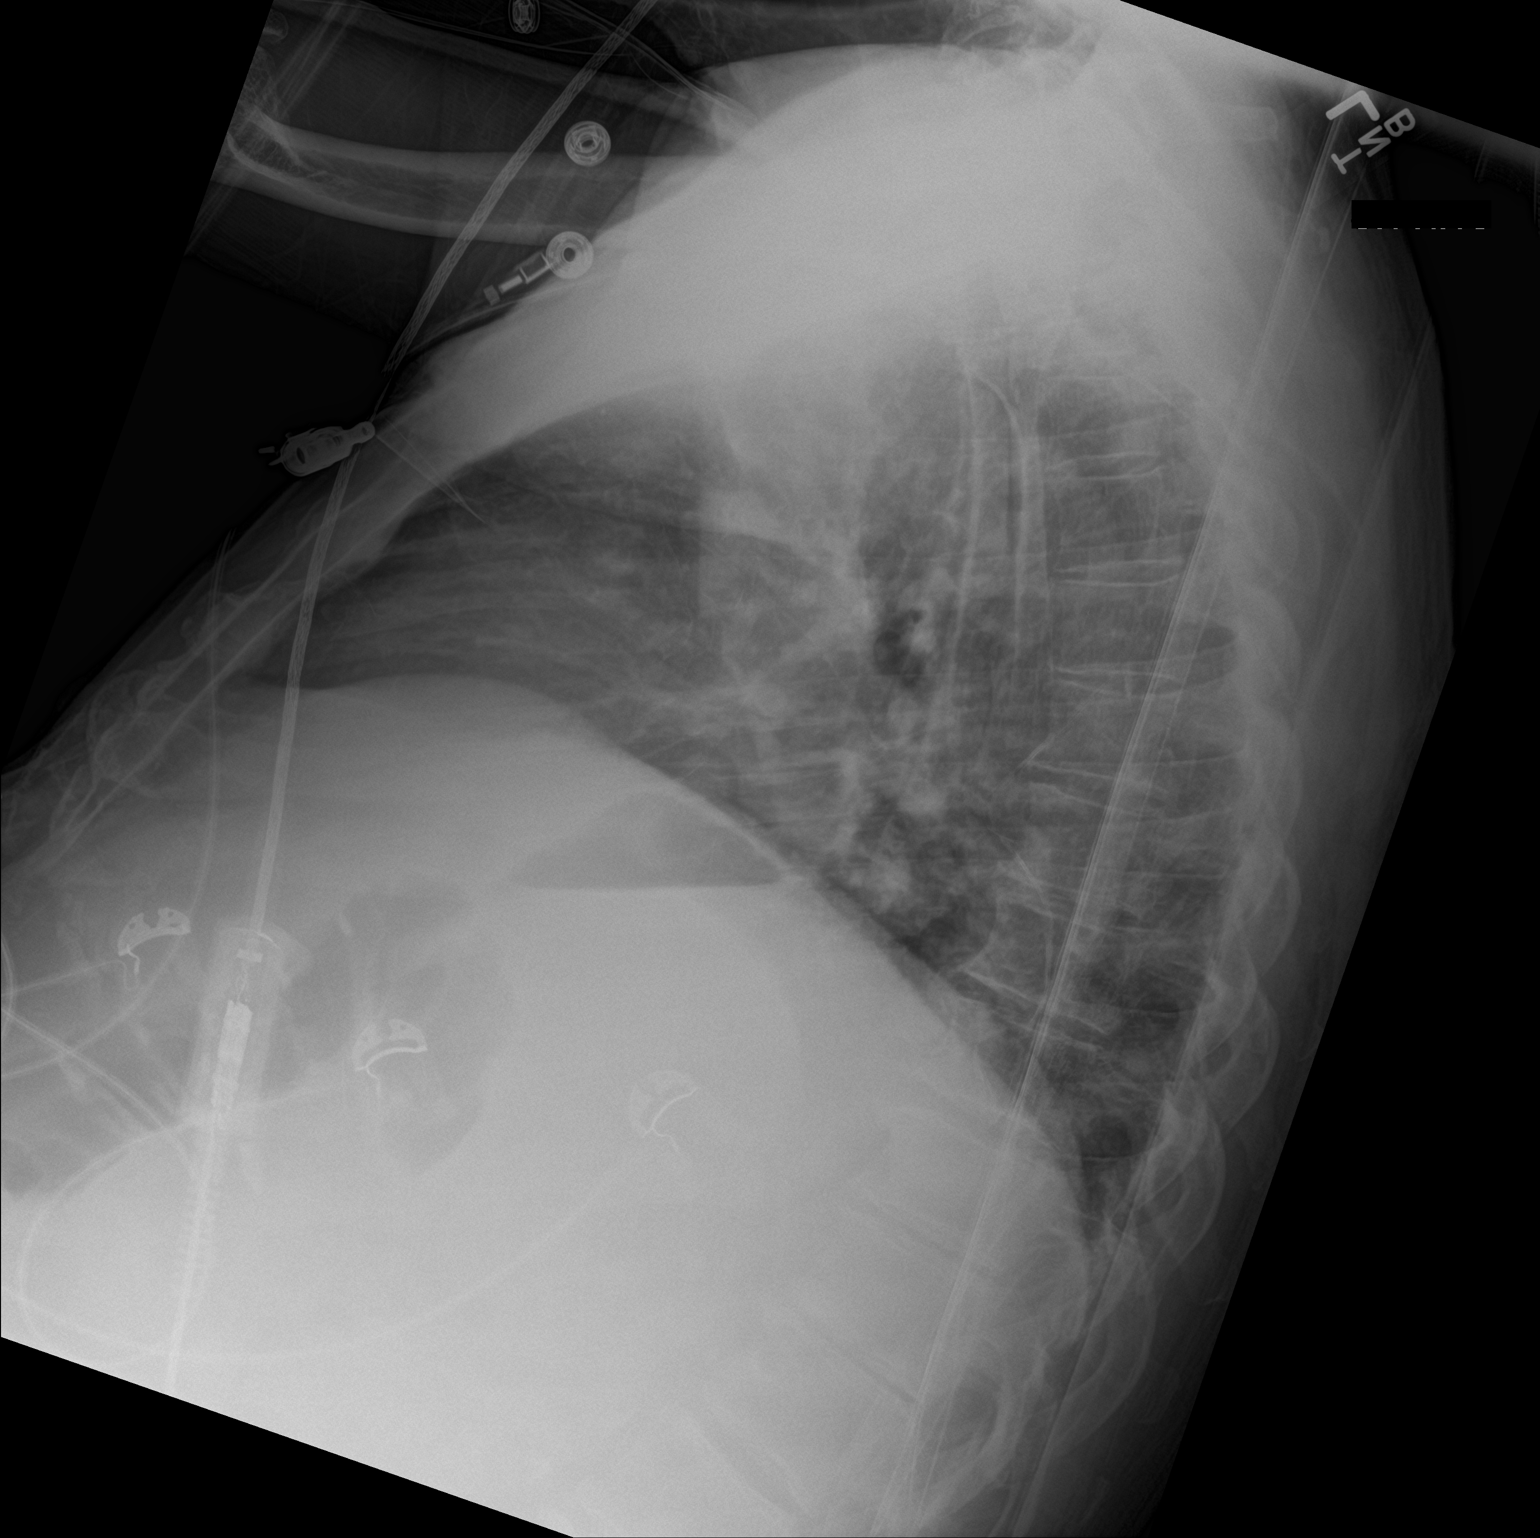

[chest ap]
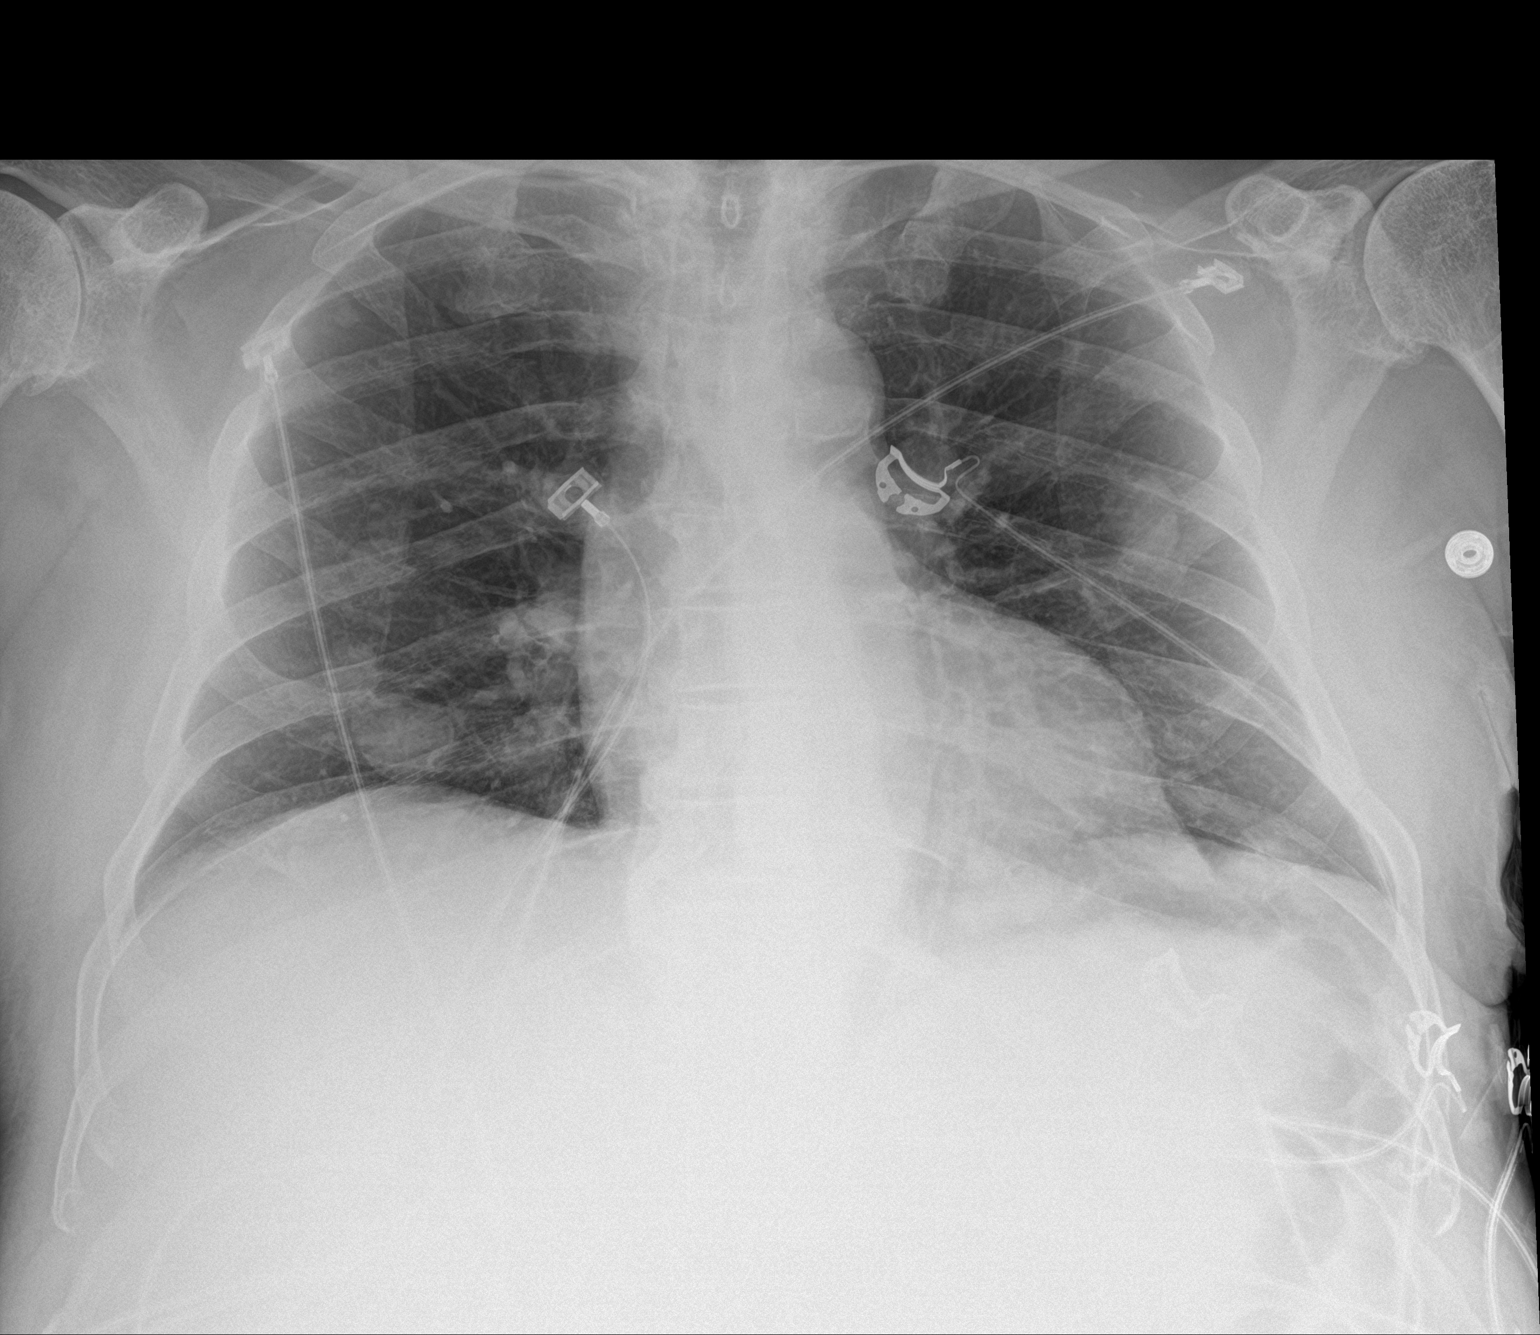

[2 of 2 positions shown; findings below may reference images not displayed]

FINDINGS: The heart size and mediastinal contours are within normal limits.
Atherosclerosis of thoracic aorta is noted. No pneumothorax or
pleural effusion is noted. Rounded density is seen in left midlung,
with another rounded density seen in right lower lobe ; these are
concerning for metastatic lesions. Smaller nodule is noted in right
upper lobe. The visualized skeletal structures are unremarkable.
IMPRESSION: Bilateral pulmonary nodules are noted concerning for metastatic
disease. CT scan of the chest is recommended for further evaluation.
Aortic atherosclerosis.

## 2018-01-04 IMAGING — US US PARACENTESIS
1 series · 5 of 5 positions shown · non-contrast
Comparison: none

INDICATION: Malignant ascites.

[Series 1: us paracentesis · 0.25mm/px · 5 acquisitions, 5 frames shown]
[im 1/5]
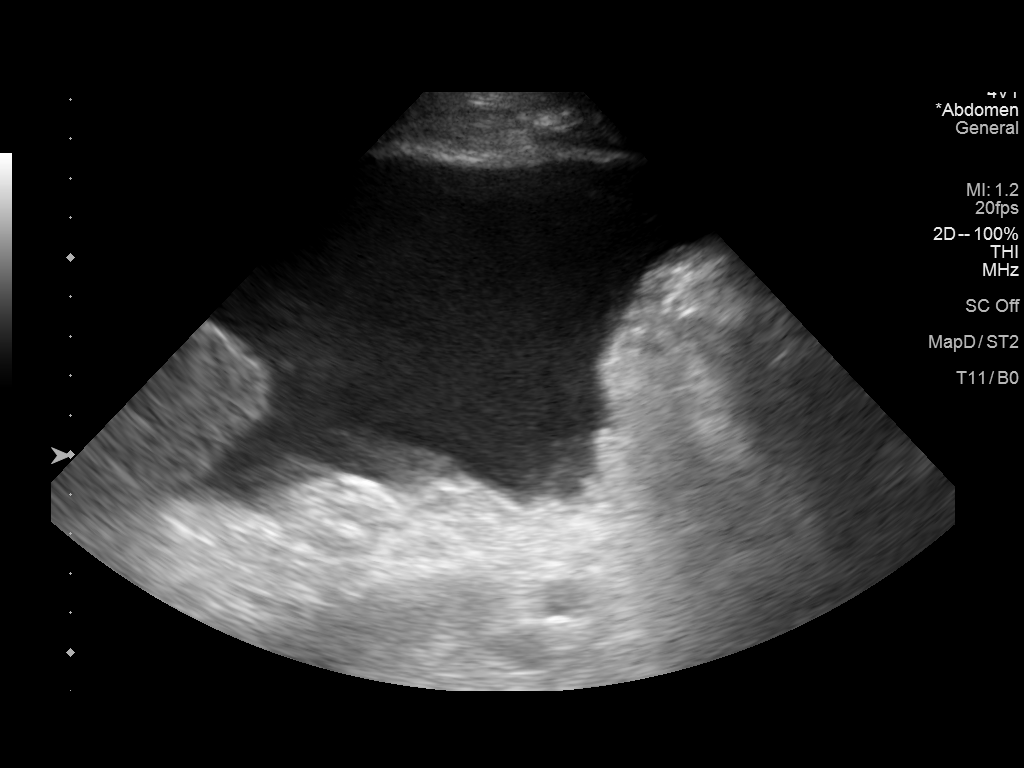
[im 2/5]
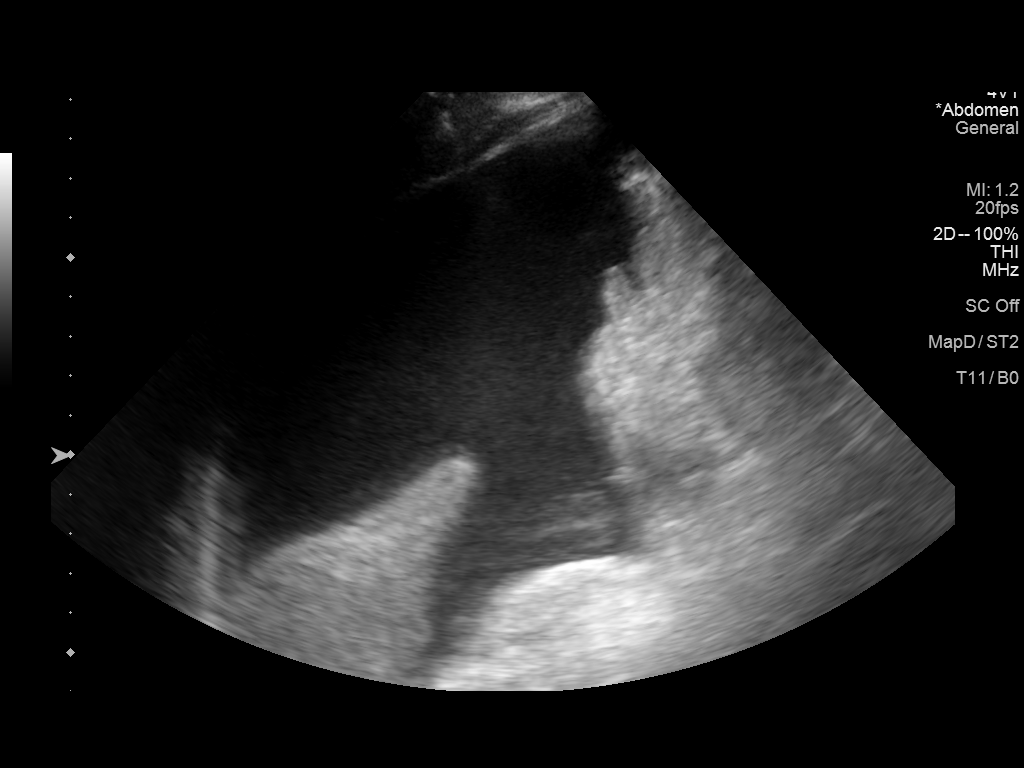
[im 3/5]
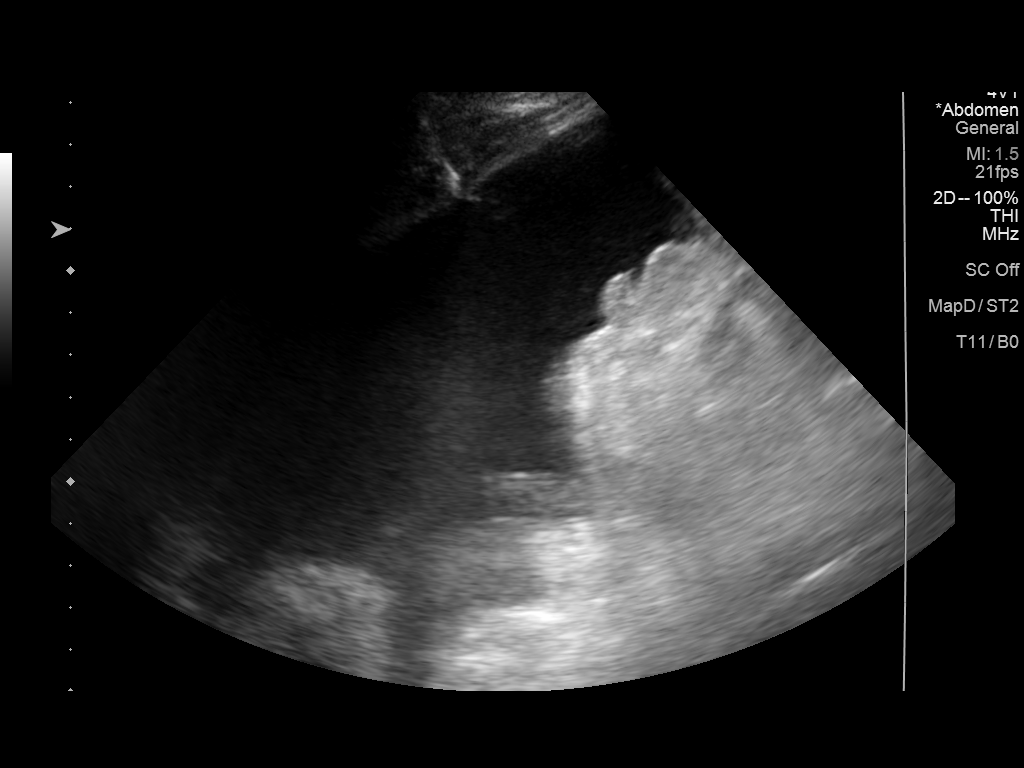
[im 4/5]
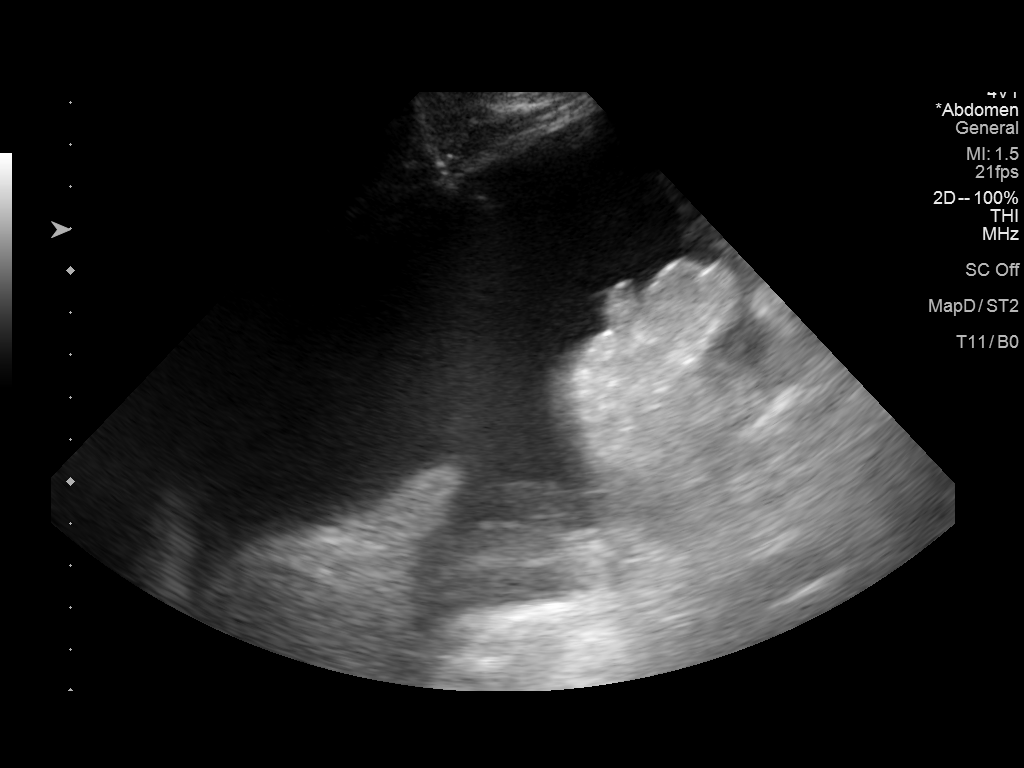
[im 5/5]
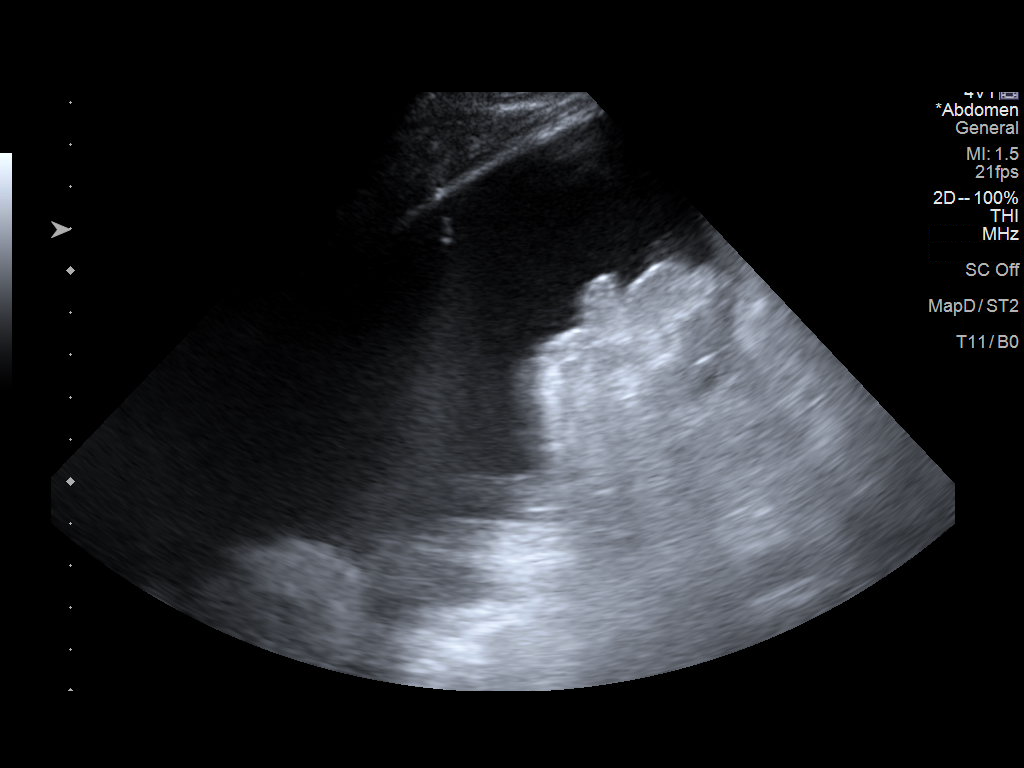

[5 of 5 positions shown; findings below may reference images not displayed]

EXAM:
ULTRASOUND GUIDED THERAPEUTIC AND DIAGNOSTIC PARACENTESIS

MEDICATIONS:
None.

COMPLICATIONS:
None immediate.

PROCEDURE:
Exam was described to the patient who was subsequently treated with
Ativan for signs of withdrawal. Before the procedure the patient had
good understanding of the risks and benefits of the procedure and
written informed consent was obtained from the patient. A timeout
was performed prior to the initiation of the procedure.

Initial ultrasound scanning demonstrates an adequate amount of
ascites within the right lower abdominal quadrant. The right lower
abdomen was prepped and draped in the usual sterile fashion. 1%
lidocaine was used for local anesthesia.

Following this, a 19 gauge, 7-cm, Yueh catheter was introduced. An
ultrasound image was saved for documentation purposes. The
paracentesis was performed. The catheter was removed and a dressing
was applied. The patient tolerated the procedure well without
immediate post procedural complication.
FINDINGS: A total of approximately 1,100 of blood-tinged and cloudy fluid was
removed. Samples were sent to the laboratory and pathology as
requested by the clinical team.
IMPRESSION: Successful ultrasound-guided paracentesis yielding 1.1 liters of
peritoneal fluid.

## 2018-01-04 IMAGING — CT CT ABD-PELV W/ CM
2 of 5 series · 13 of 36 positions shown, 16 images · IV contrast (APPLIED)
Comparison: None.

CLINICAL DATA: Anemia and hyponatremia.

EXAM:
CT CHEST, ABDOMEN, AND PELVIS WITH CONTRAST
TECHNIQUE: Multidetector CT imaging of the chest, abdomen and pelvis was
performed following the standard protocol during bolus
administration of intravenous contrast.
CONTRAST:  100mL [id number] IOPAMIDOL ([id number]) INJECTION 61%

[Series 2: cap with · axial · 0.95mm/px · z∈[-607,-52]mm · 10 of 137 slices shown, 13 images]
[im 13/137  mediastinal]
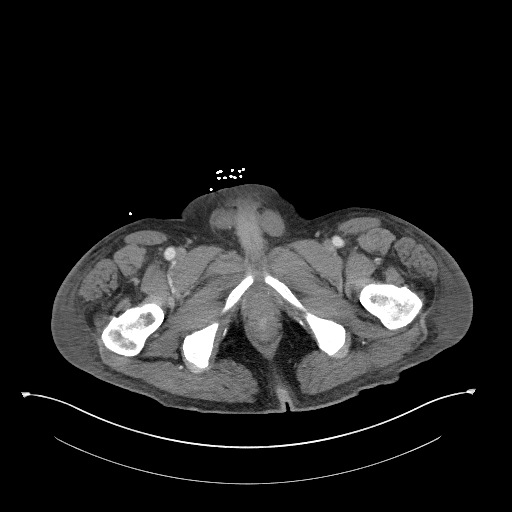
[im 13/137  lung]
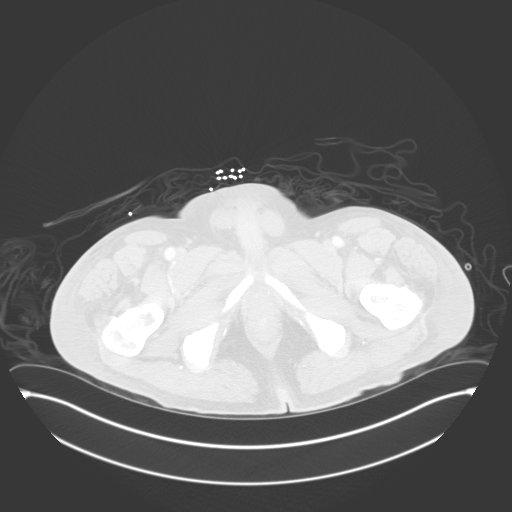
[im 25/137  lung]
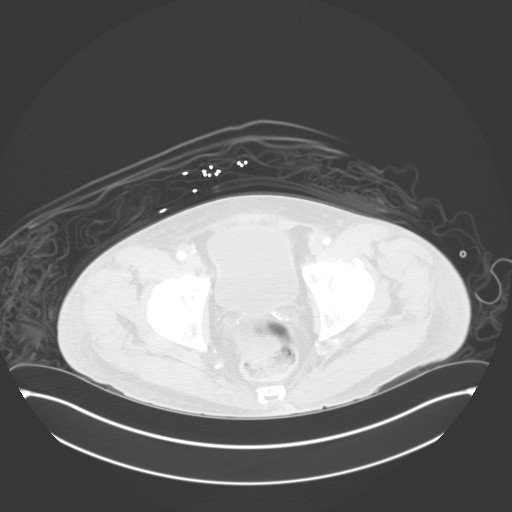
[im 38/137  lung]
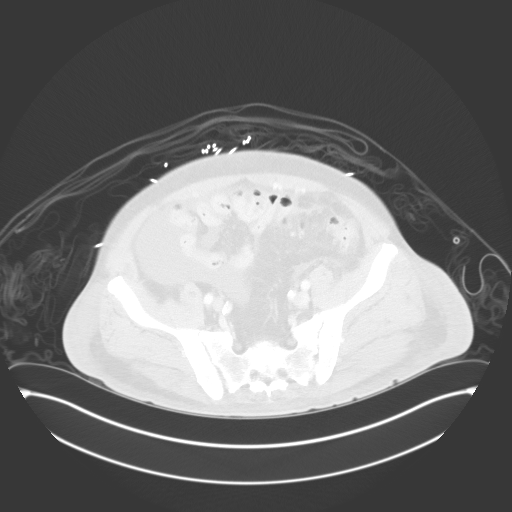
[im 50/137  lung]
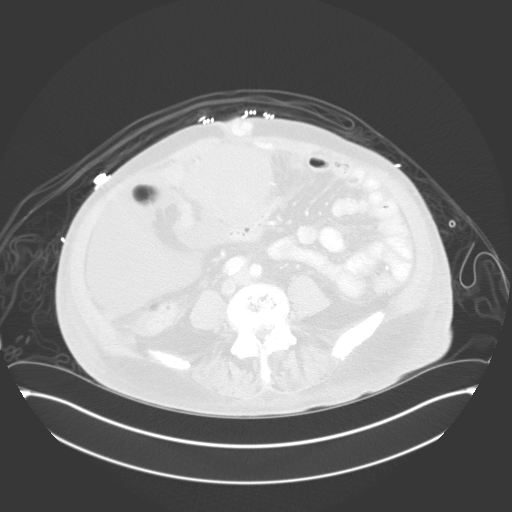
[im 62/137  mediastinal]
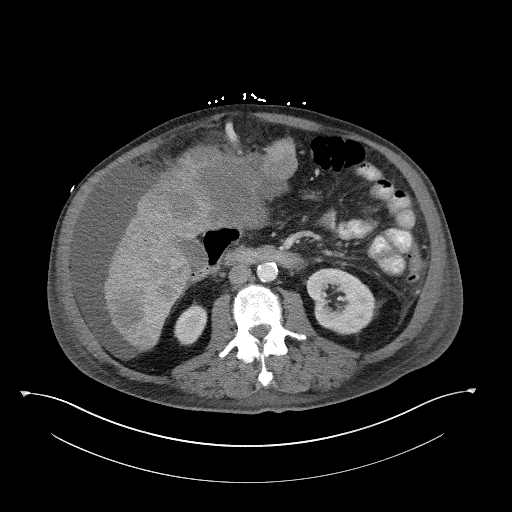
[im 62/137  lung]
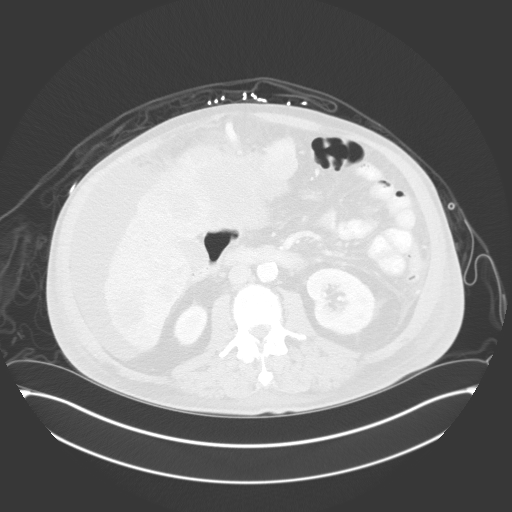
[im 75/137  lung]
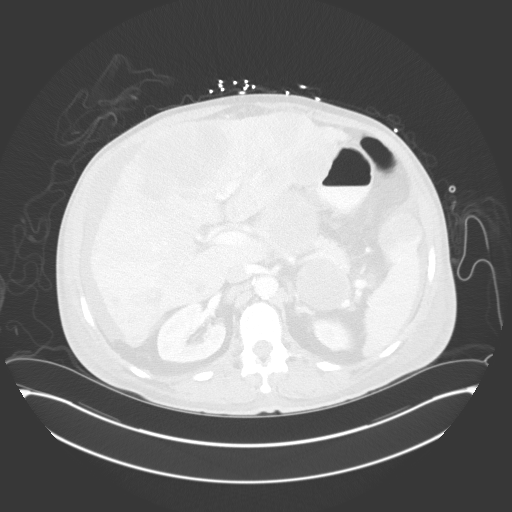
[im 87/137  lung]
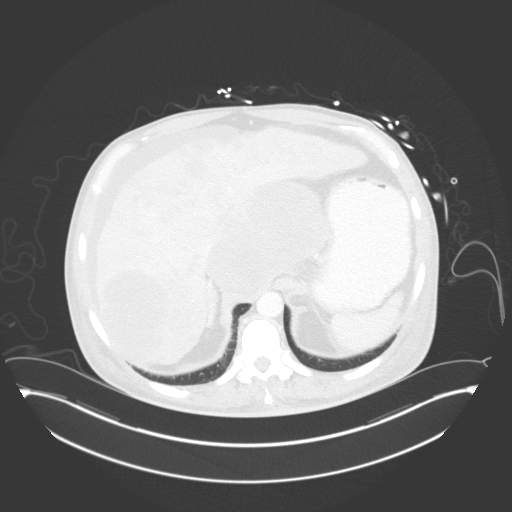
[im 99/137  lung]
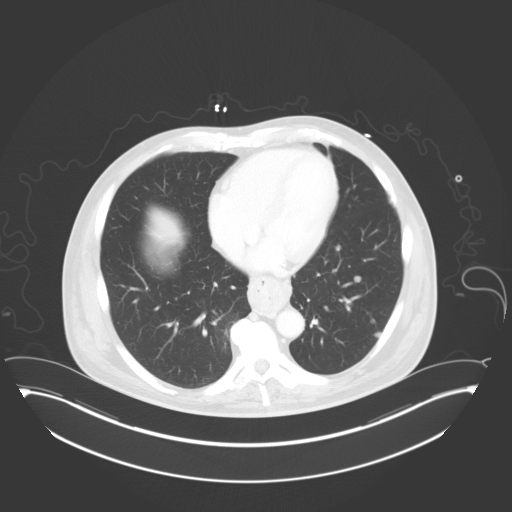
[im 112/137  mediastinal]
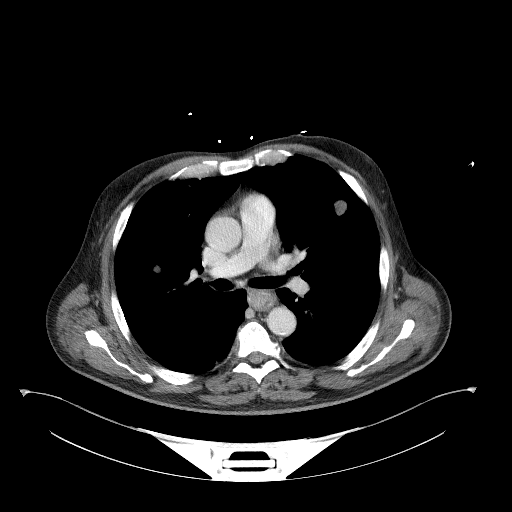
[im 112/137  lung]
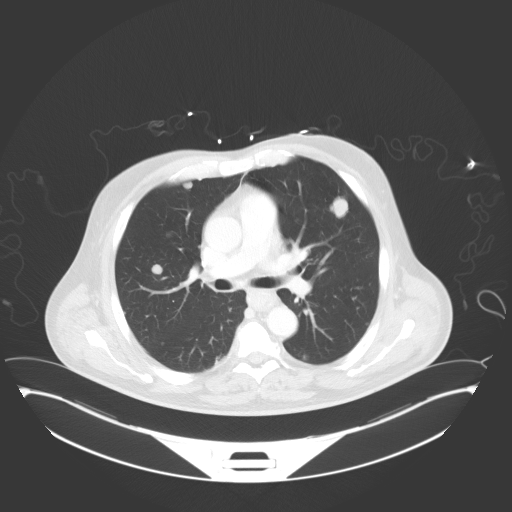
[im 124/137  lung]
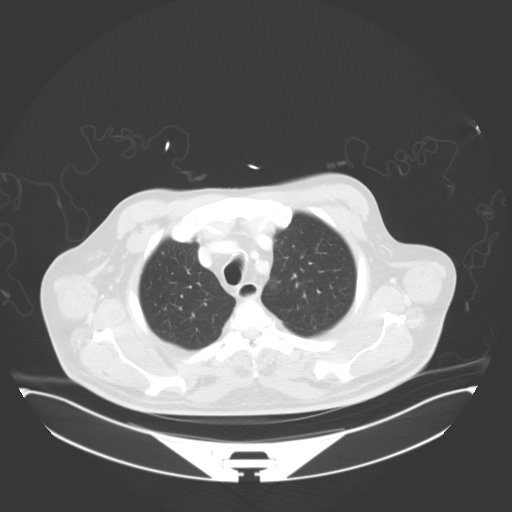

[Series 5: coronals · coronal · 0.86mm/px · 3 of 151 slices shown]
[im 31/151  lung]
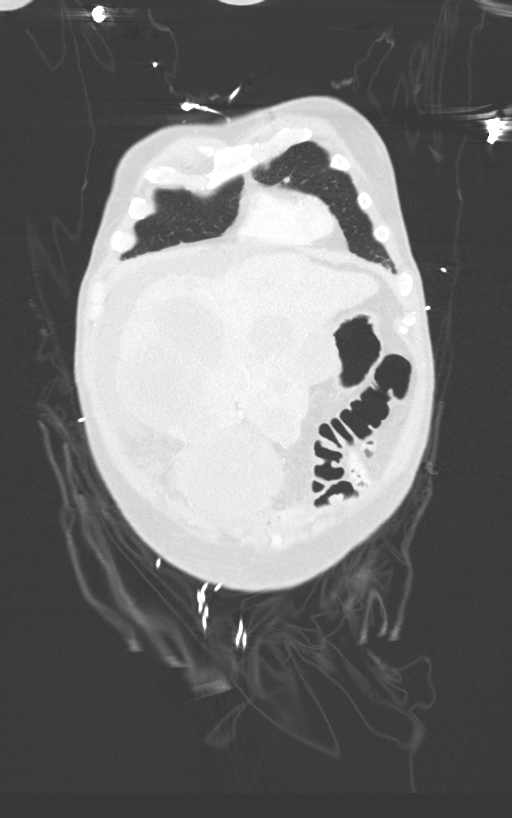
[im 61/151  lung]
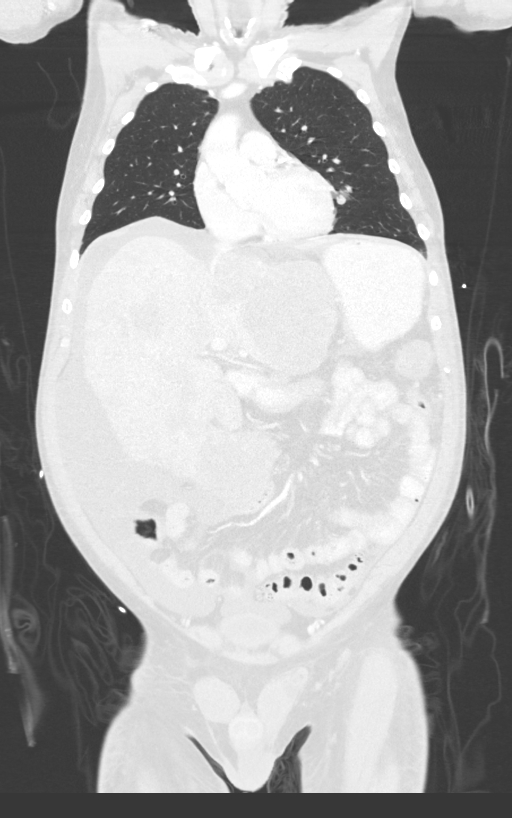
[im 91/151  lung]
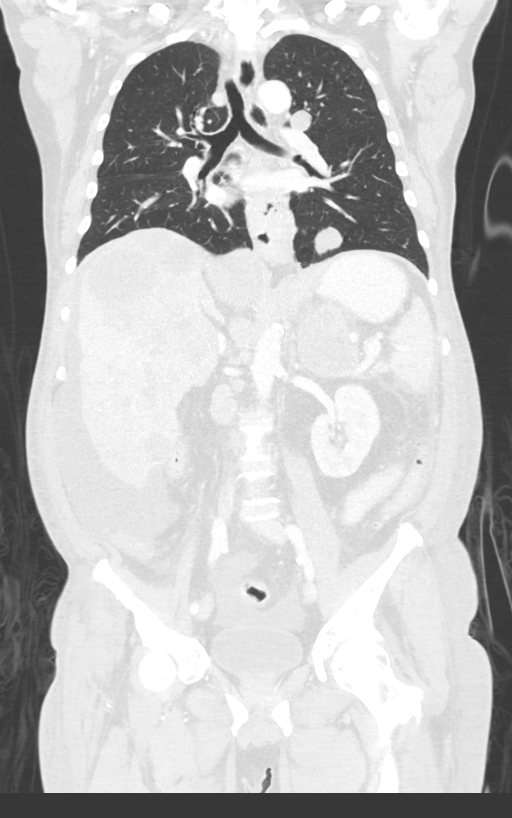

[13 of 36 positions shown; findings below may reference images not displayed]

FINDINGS: CT CHEST FINDINGS

Cardiovascular: No cardiomegaly or pericardial effusion. Diffuse
atherosclerotic calcification, including the coronary arteries.

Mediastinum/Nodes: Diffuse thickening of the esophagus with luminal
irregularity. The thickening does not enhance typical of varices.
Surprising absence of mediastinal adenopathy.

Lungs/Pleura: Innumerable lobulated discrete nodules consistent with
metastatic disease, measuring up to 27 mm at the right base. No
effusion or pneumothorax. No superimposed pneumonia or edema.

Musculoskeletal: No acute or aggressive finding

CT ABDOMEN PELVIS FINDINGS

Hepatobiliary: Cirrhotic liver morphology with recannulized
umbilical vein and innumerable bulky masses. A segment 4B mass
extends through the capsule.

No evidence of biliary obstruction or stone.

Pancreas: Unremarkable.

Spleen: Lobulated mass in the anterior spleen measuring up to 4 cm,
presumed metastasis. There are at least 2 other smaller hypo
enhancing areas.

Adrenals/Urinary Tract: 22 by 16 mm right adrenal mass consistent
with metastasis in this setting. No hydronephrosis or stone.
Unremarkable bladder.

Stomach/Bowel:  No obstruction or mass. No inflammatory findings.

Vascular/Lymphatic: Portal hypertension with recannulized
periumbilical vein. Adenopathy in the upper abdominal ligaments and
pre/periaortic locations consistent with metastases.

Reproductive:No pathologic findings.

Other: Moderate ascites without generalized peritoneal nodularity.

Musculoskeletal: No acute or aggressive finding.
IMPRESSION: 1. Widespread metastatic disease present in the lungs, liver,
abdominal nodes, right adrenal gland, and spleen. Esophageal
adenocarcinoma or hepatocellular carcinoma (background cirrhosis) is
the favored primary.
2. Moderate ascites. One of the large left liver masses has
extracapsular growth.
